# Patient Record
Sex: Female | Born: 1960 | Race: White | Hispanic: No | Marital: Single | Smoking: Never smoker
Health system: Southern US, Community
[De-identification: ages and names within clinical notes are randomized; demographics above are authoritative.]

## PROBLEM LIST (undated history)

## (undated) DIAGNOSIS — F32A Depression, unspecified: Secondary | ICD-10-CM

## (undated) DIAGNOSIS — I10 Essential (primary) hypertension: Secondary | ICD-10-CM

## (undated) DIAGNOSIS — F329 Major depressive disorder, single episode, unspecified: Secondary | ICD-10-CM

## (undated) DIAGNOSIS — E119 Type 2 diabetes mellitus without complications: Secondary | ICD-10-CM

## (undated) DIAGNOSIS — E785 Hyperlipidemia, unspecified: Secondary | ICD-10-CM

## (undated) DIAGNOSIS — F419 Anxiety disorder, unspecified: Secondary | ICD-10-CM

## (undated) HISTORY — DX: Anxiety disorder, unspecified: F41.9

## (undated) HISTORY — DX: Essential (primary) hypertension: I10

## (undated) HISTORY — DX: Major depressive disorder, single episode, unspecified: F32.9

## (undated) HISTORY — PX: WISDOM TOOTH EXTRACTION: SHX21

## (undated) HISTORY — DX: Type 2 diabetes mellitus without complications: E11.9

## (undated) HISTORY — PX: OTHER SURGICAL HISTORY: SHX169

## (undated) HISTORY — DX: Hyperlipidemia, unspecified: E78.5

## (undated) HISTORY — DX: Depression, unspecified: F32.A

---

## 2005-03-26 HISTORY — PX: CHOLECYSTECTOMY: SHX55

## 2012-07-07 ENCOUNTER — Ambulatory Visit: Payer: Self-pay | Admitting: Family Medicine

## 2012-07-21 ENCOUNTER — Ambulatory Visit: Payer: Self-pay | Admitting: Family Medicine

## 2012-07-22 ENCOUNTER — Ambulatory Visit: Payer: Self-pay | Admitting: Family Medicine

## 2012-07-29 ENCOUNTER — Ambulatory Visit: Payer: Self-pay | Admitting: Family Medicine

## 2012-09-04 ENCOUNTER — Ambulatory Visit: Payer: Self-pay | Admitting: Obstetrics and Gynecology

## 2012-09-04 LAB — BASIC METABOLIC PANEL
Anion Gap: 7 (ref 7–16)
BUN: 14 mg/dL (ref 7–18)
Calcium, Total: 9.4 mg/dL (ref 8.5–10.1)
Co2: 29 mmol/L (ref 21–32)
Creatinine: 0.71 mg/dL (ref 0.60–1.30)
EGFR (Non-African Amer.): >60
Glucose: 78 mg/dL (ref 65–99)
Potassium: 3.8 mmol/L (ref 3.5–5.1)
Sodium: 137 mmol/L (ref 136–145)

## 2012-09-04 LAB — HEMOGLOBIN: HGB: 11.9 g/dL — ABNORMAL LOW (ref 12.0–16.0)

## 2012-09-12 ENCOUNTER — Ambulatory Visit: Payer: Self-pay | Admitting: Obstetrics and Gynecology

## 2012-09-15 LAB — PATHOLOGY REPORT

## 2013-03-16 ENCOUNTER — Ambulatory Visit: Payer: Self-pay | Admitting: Gastroenterology

## 2013-03-17 LAB — PATHOLOGY REPORT

## 2013-10-23 ENCOUNTER — Ambulatory Visit: Payer: Self-pay | Admitting: Family Medicine

## 2013-11-24 IMAGING — US US EXTREM UP VENOUS*R*
1 series · 14 of 24 positions shown · non-contrast
Comparison: none

REASON FOR EXAM: CALL REPORT Sarah Cell [phone] Righ forearm pain w
superficial bruising E...
COMMENTS:

PROCEDURE:     US  - US DOPPLER UP EXTR RIGHT  - July 21, 2012  [DATE]
RESULT:     Comparison: None
TECHNIQUE: Multiple gray-scale, color-flow Doppler, and spectral waveform
tracings of the right internal jugular vein, brachiocephalic vein, and
proximal deep veins of the right upper extremity from the antecubital fossa
to the brachiocephalic vein are presented for review.

[Series 1: us extrem up venous*right* · 0.08mm/px · 14 of 43 slices shown]
[im 1/43]
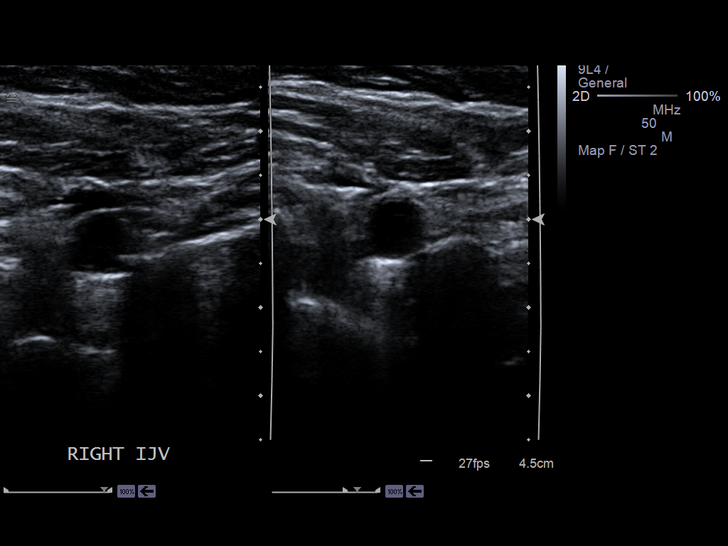
[im 4/43]
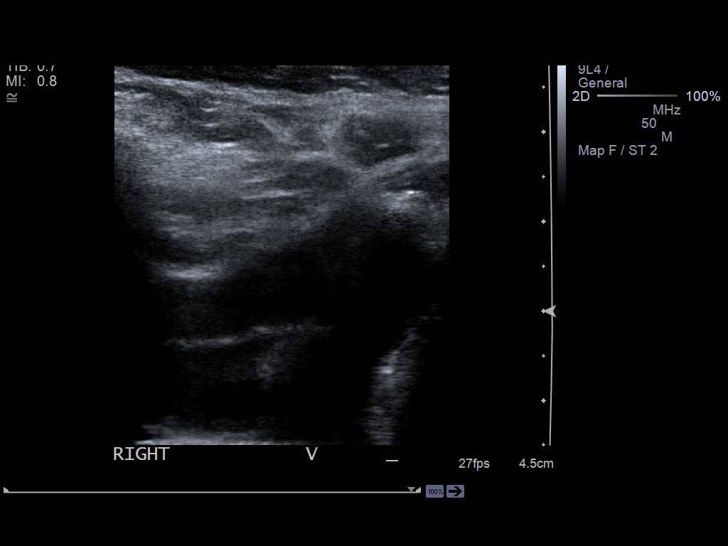
[im 8/43]
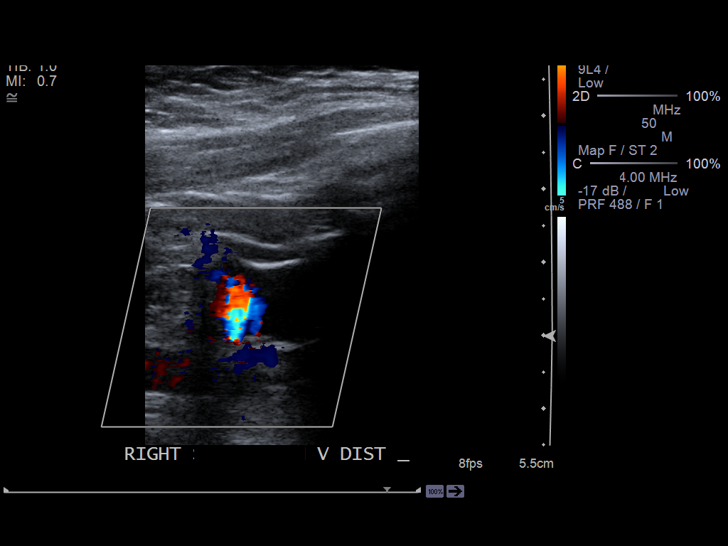
[im 11/43]
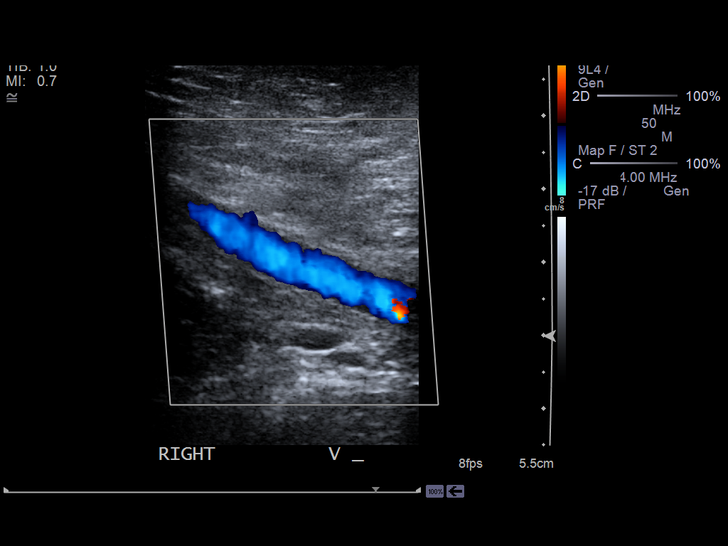
[im 13/43]
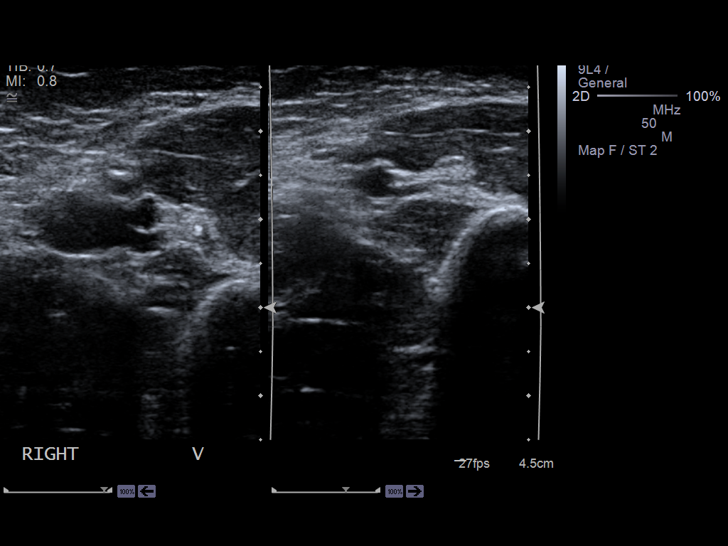
[im 17/43]
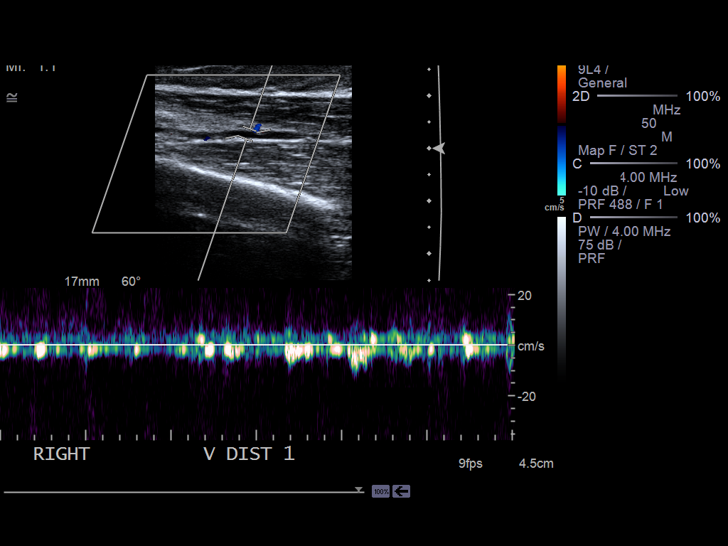
[im 21/43]
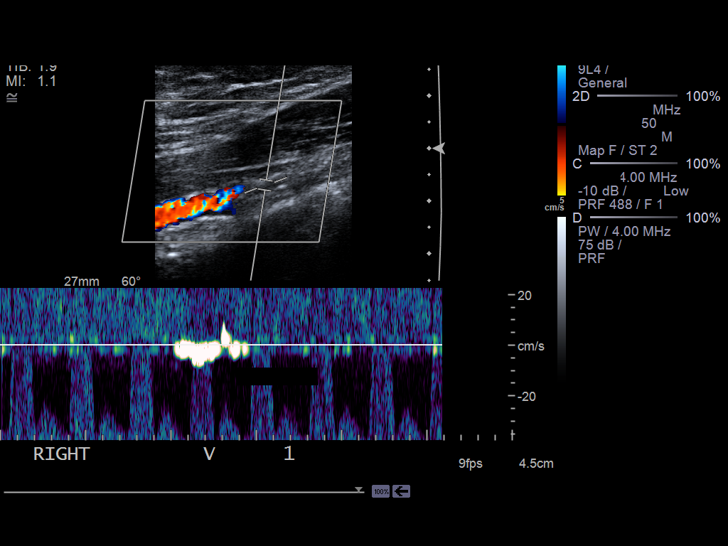
[im 22/43]
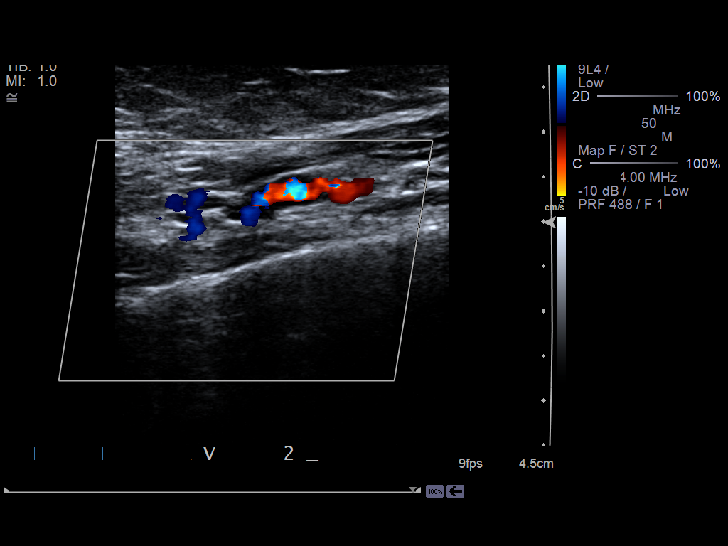
[im 26/43]
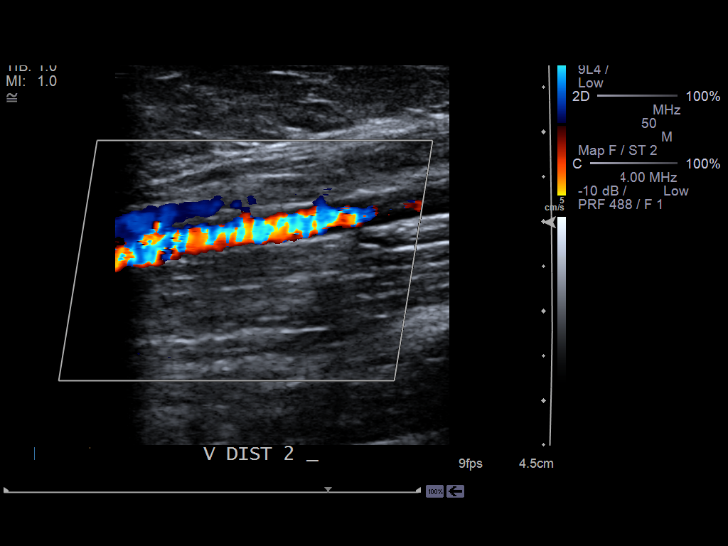
[im 30/43]
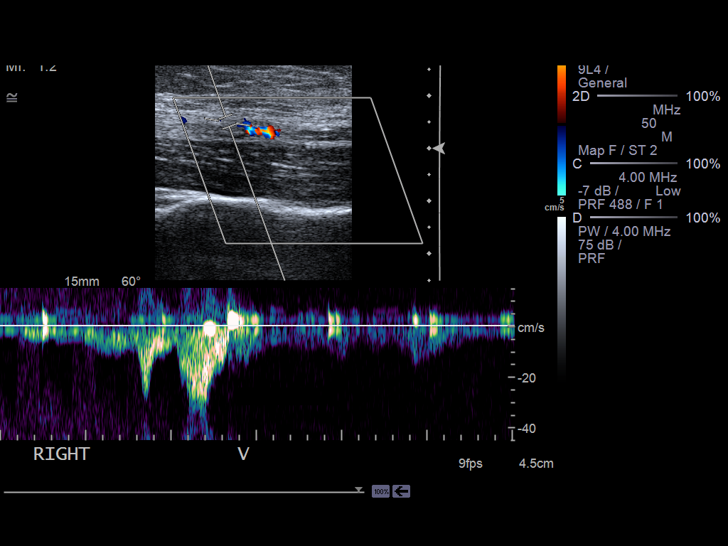
[im 33/43]
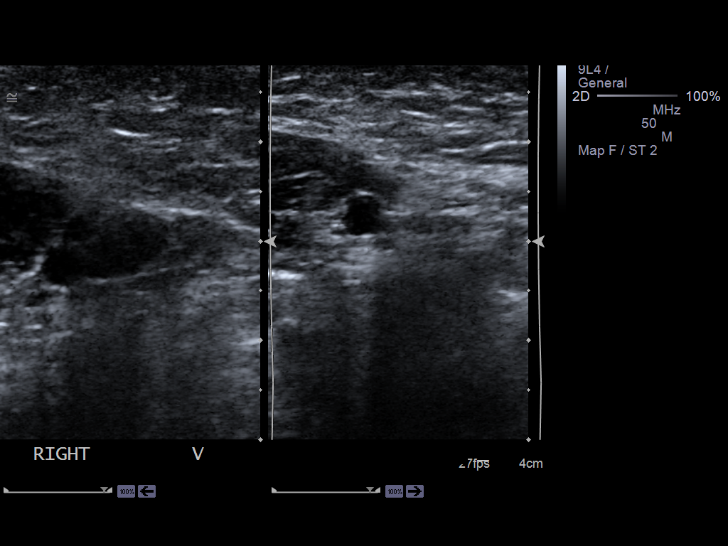
[im 35/43]
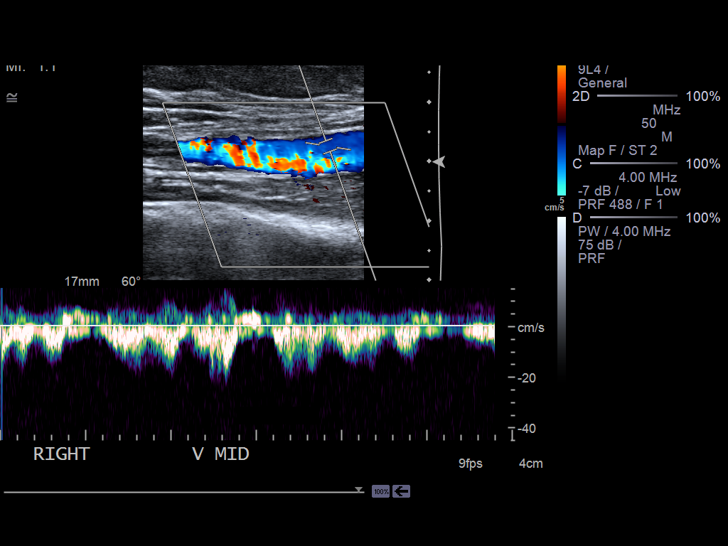
[im 39/43]
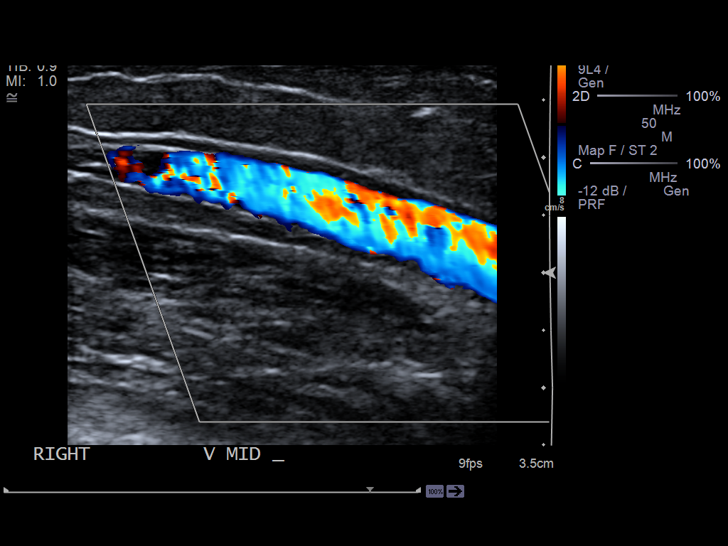
[im 43/43]
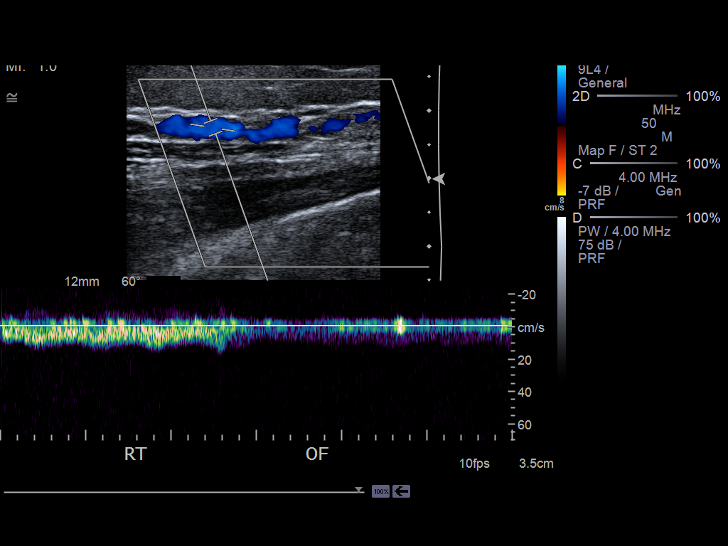

[14 of 24 positions shown; findings below may reference images not displayed]

FINDINGS: There is normal blood flow within the right internal jugular vein,
visualized portions of the brachiocephalic vein, and right upper extremity
deep venous system from the antecubital fossa to the brachiocephalic vein.
IMPRESSION: No sonographic evidence of deep venous thrombosis in the right upper
extremity.

[REDACTED]

## 2013-11-25 IMAGING — US US PELV - US TRANSVAGINAL
1 series · 14 of 25 positions shown · non-contrast
Comparison: none

REASON FOR EXAM: Abn Vaginal Bleeding
COMMENTS:

[Series 1: us pelv - us transvaginal · 0.26mm/px · 14 of 56 slices shown]
[im 1/56]
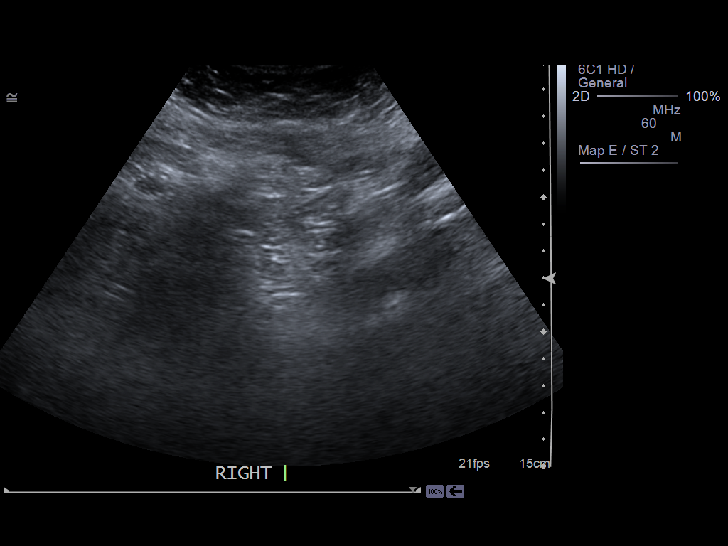
[im 5/56]
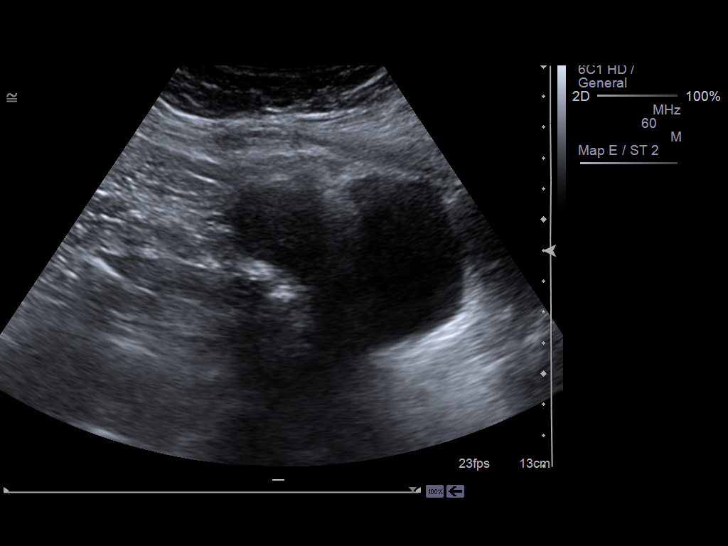
[im 10/56]
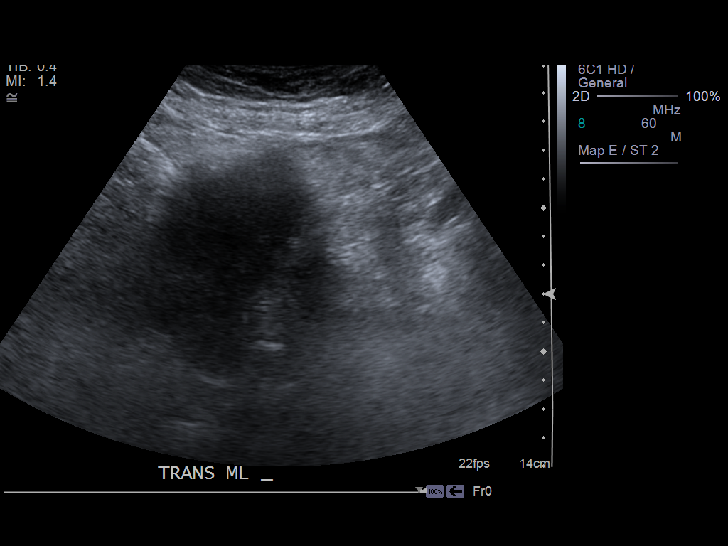
[im 14/56]
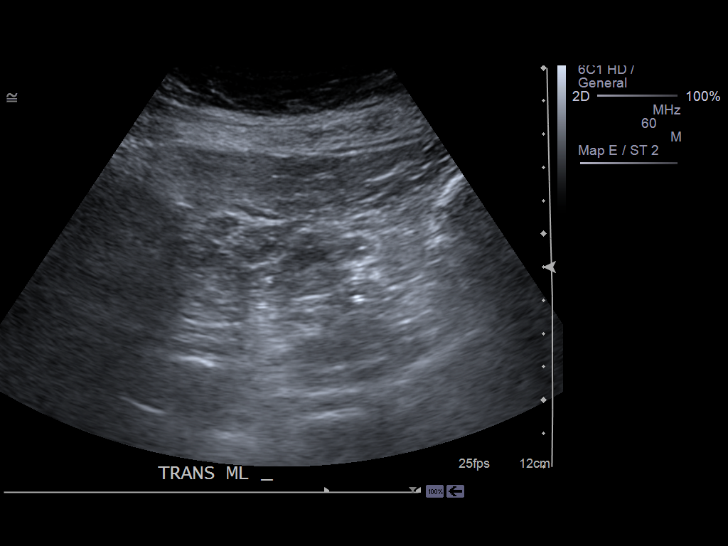
[im 19/56]
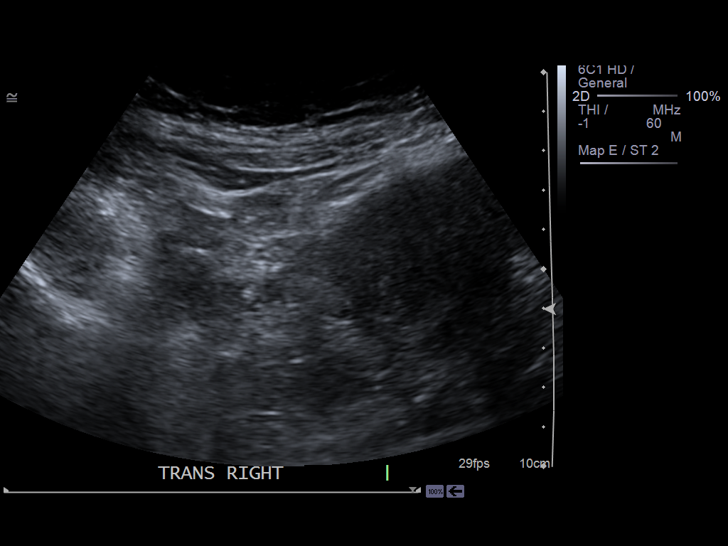
[im 21/56]
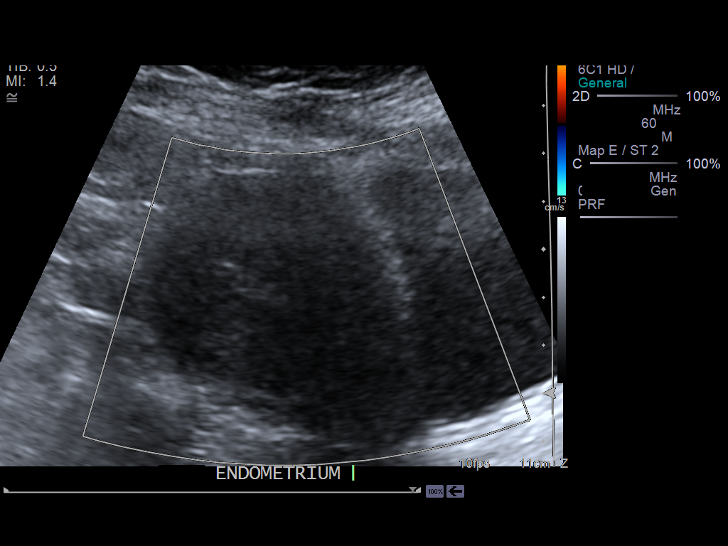
[im 26/56]
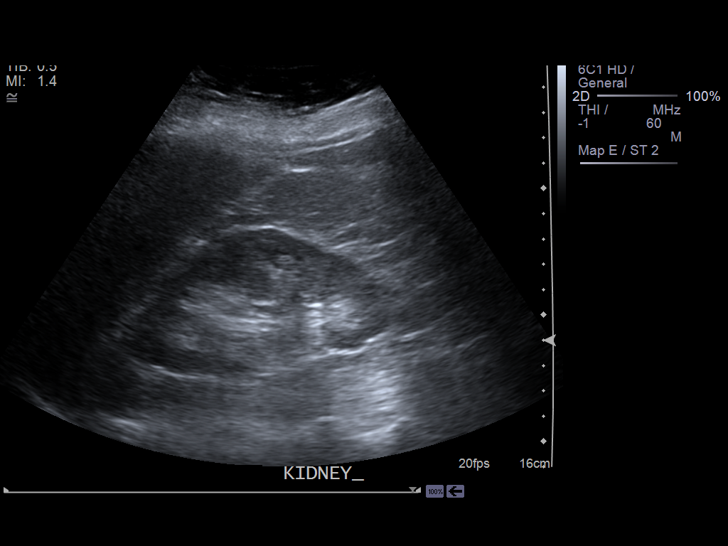
[im 30/56]
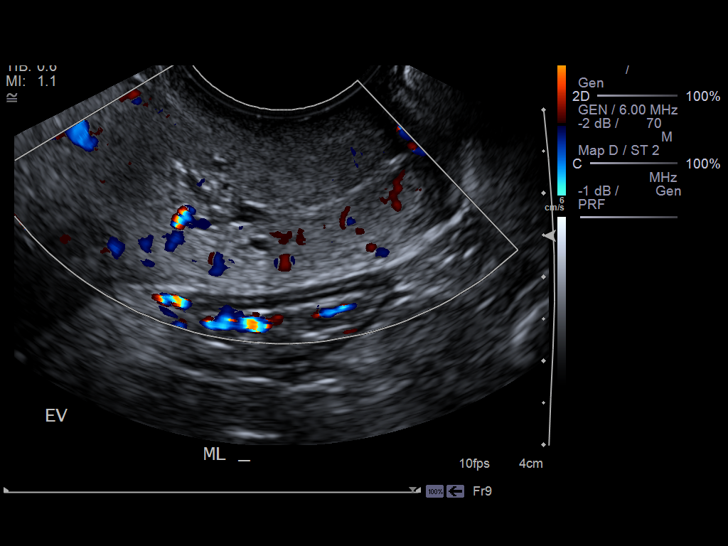
[im 35/56]
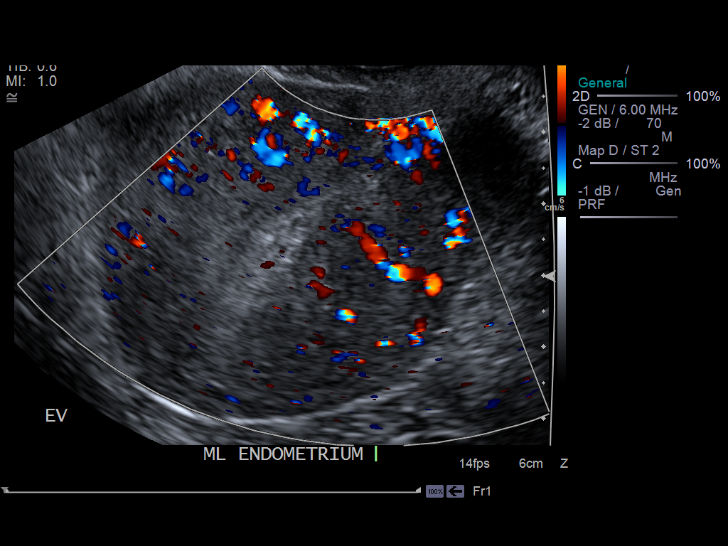
[im 37/56]
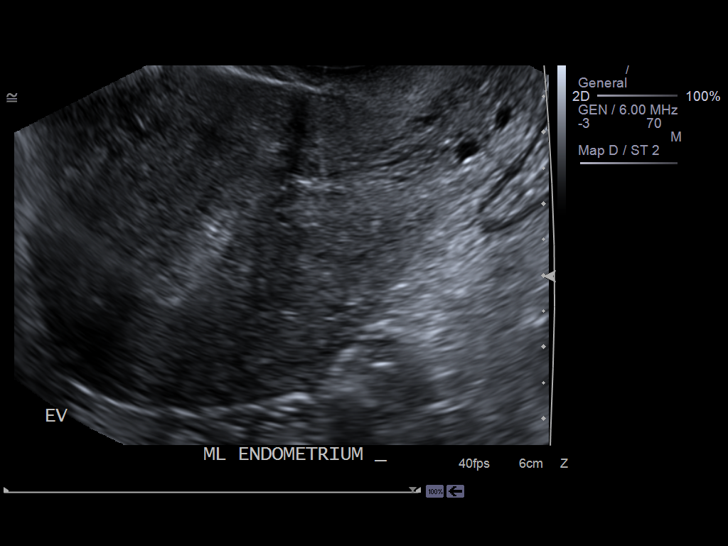
[im 42/56]
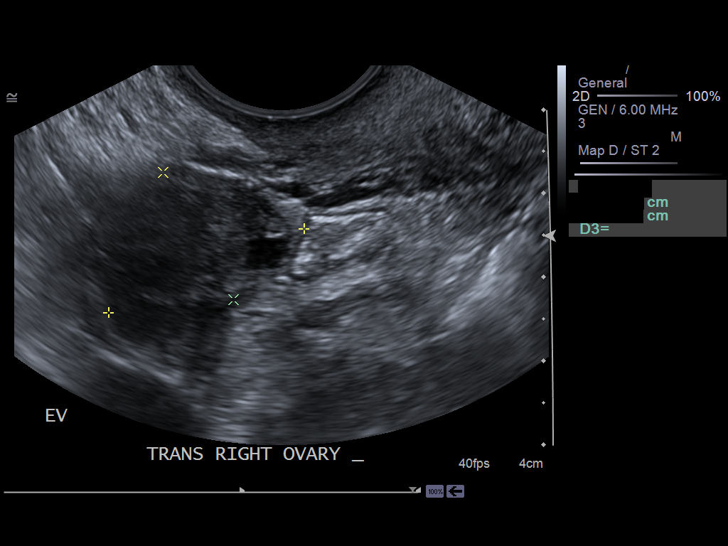
[im 46/56]
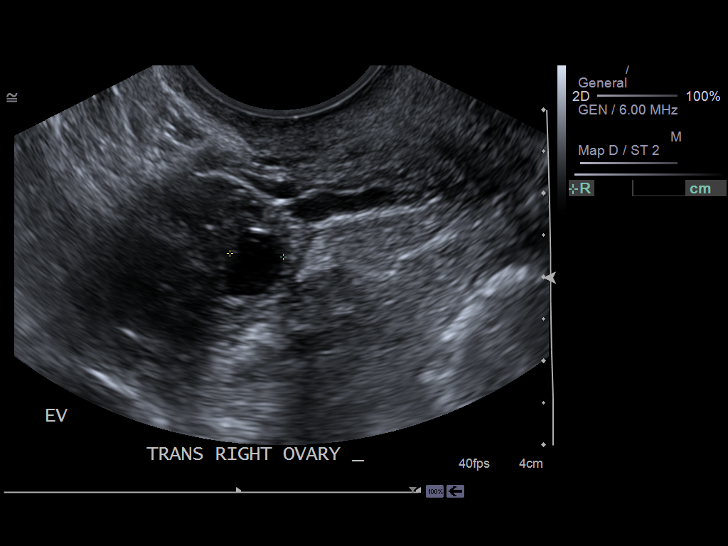
[im 51/56]
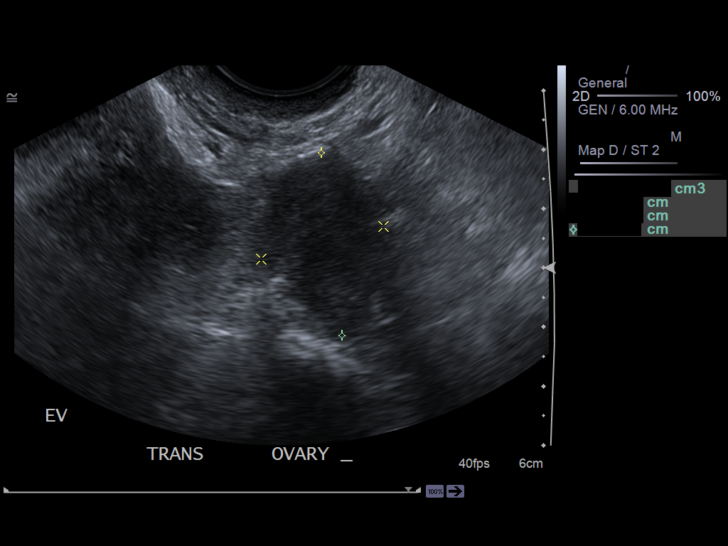
[im 56/56]
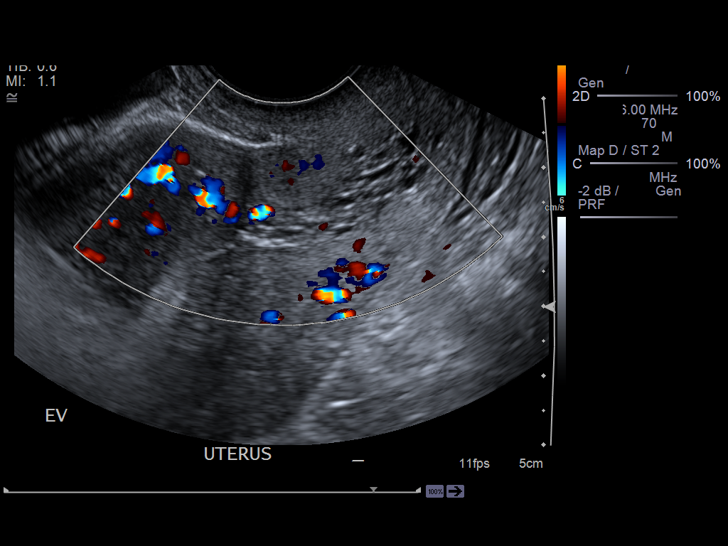

[14 of 25 positions shown; findings below may reference images not displayed]

PROCEDURE:     US  - US PELVIS EXAM W/TRANSVAGINAL  - July 22, 2012  [DATE]

RESULT:     Transabdominal and transvaginal imaging was performed through
the pelvis.

The uterus is normal in echotexture and contour. It measures 8.1 x 6.1 x
cm. The endometrial stripe is thickened at just under 10 cm. There is no
free fluid in the cul-de-sac.

The right ovary measures 2.5 x 1.7 x 2.3 cm and contains a cyst measuring
1.2 x 0.8 x 0.6 cm. The left ovary is normal in echotexture and measures
x 2.1 x 3.1 cm. Vascularity of the ovaries is normal.
IMPRESSION: 1. There is abnormal thickening of the endometrium to just under 10 cm in
thickness. It is somewhat irregular in echotexture and demonstrates some
hypervascularity.
2. There is a simple appearing cyst associated with the normal-appearing
right ovary. The left ovary is normal in appearance.

Gynecological evaluation is recommended.

[REDACTED]

## 2013-12-02 IMAGING — MG MAM DGTL SCRN MAM NO ORDER W/CAD
1 series · 4 of 4 positions shown · non-contrast
Comparison: none

REASON FOR EXAM: SCR MAMMO NO ORDER
COMMENTS:

PROCEDURE:     MAM - MAM DGTL SCRN MAM NO ORDER W/CAD  - July 29, 2012  [DATE]
RESULT:     Nodular parenchymal pattern. No mass. No pathologic clustered
calcification. CAD evaluation is nonfocal.

[R CC · right · 4 of 4 slices shown]
[im 1/4]
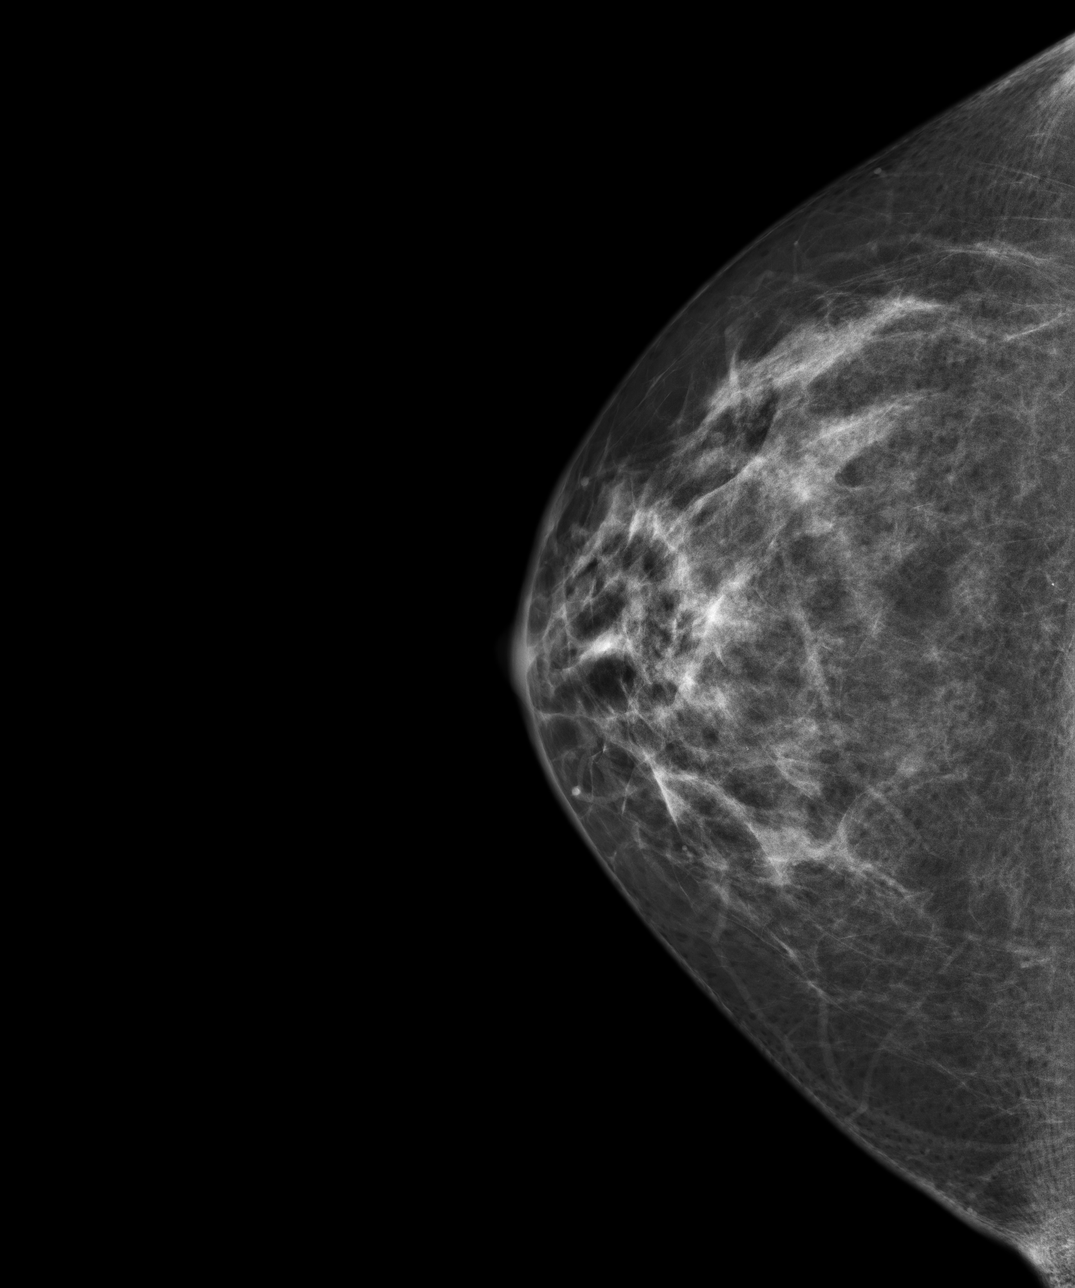
[im 2/4]
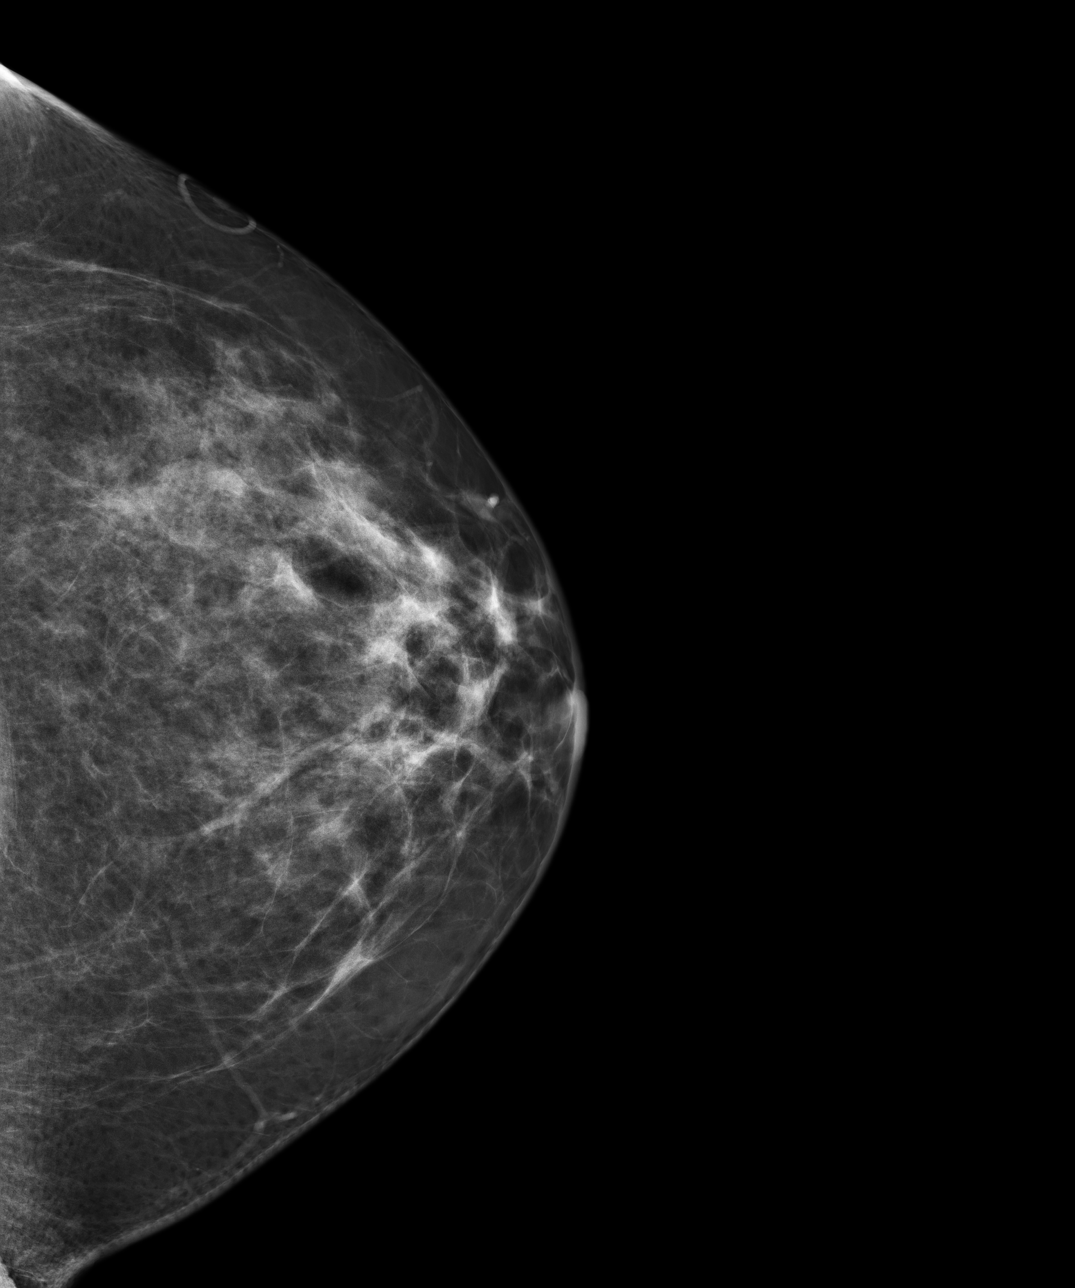
[im 3/4]
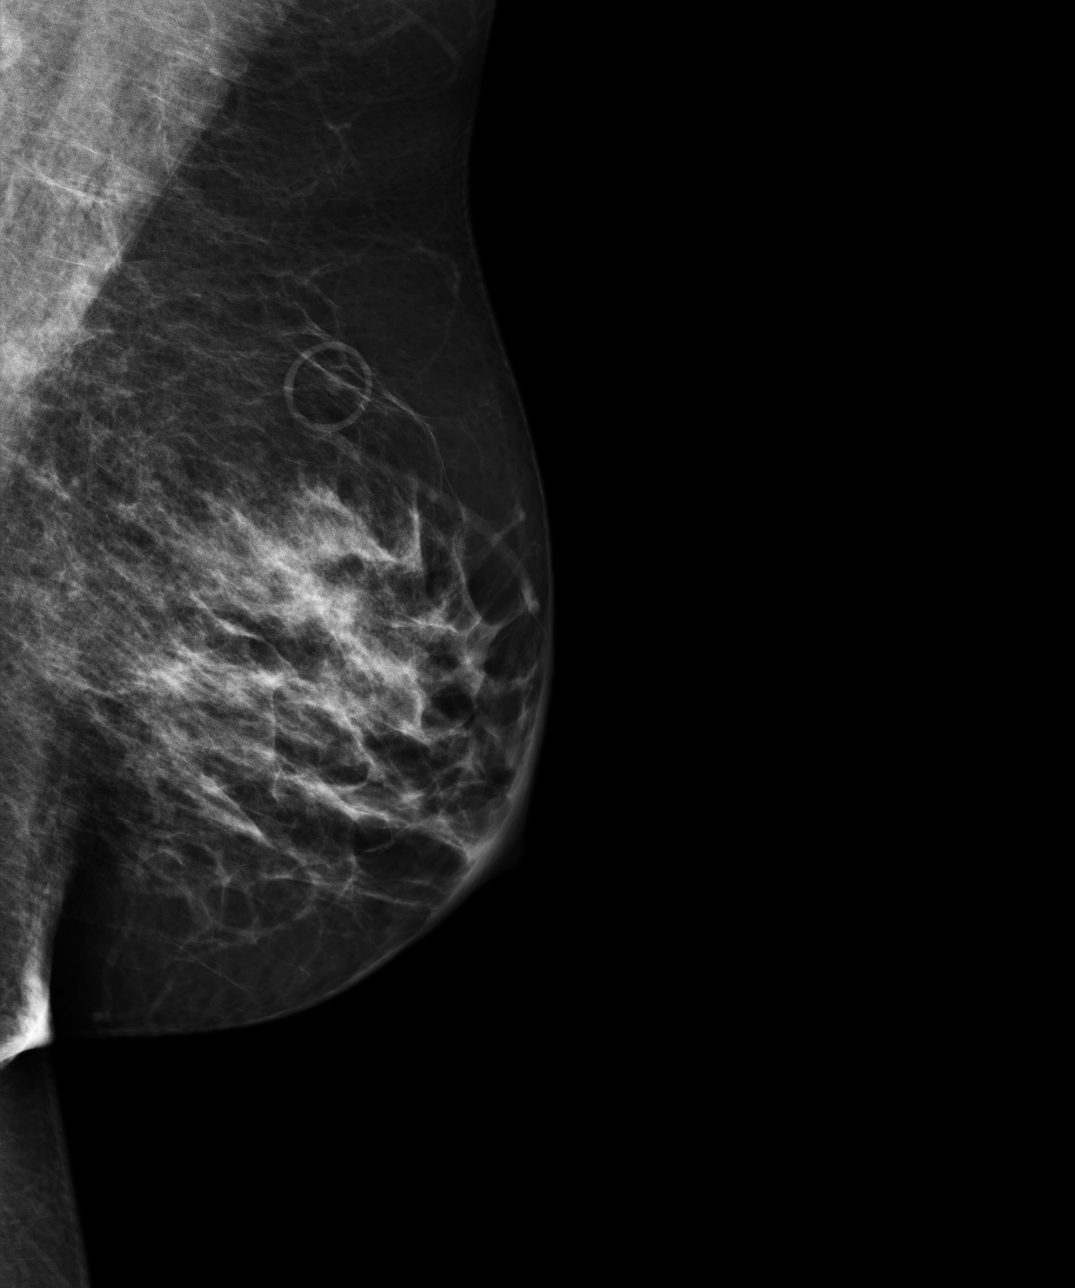
[im 4/4]
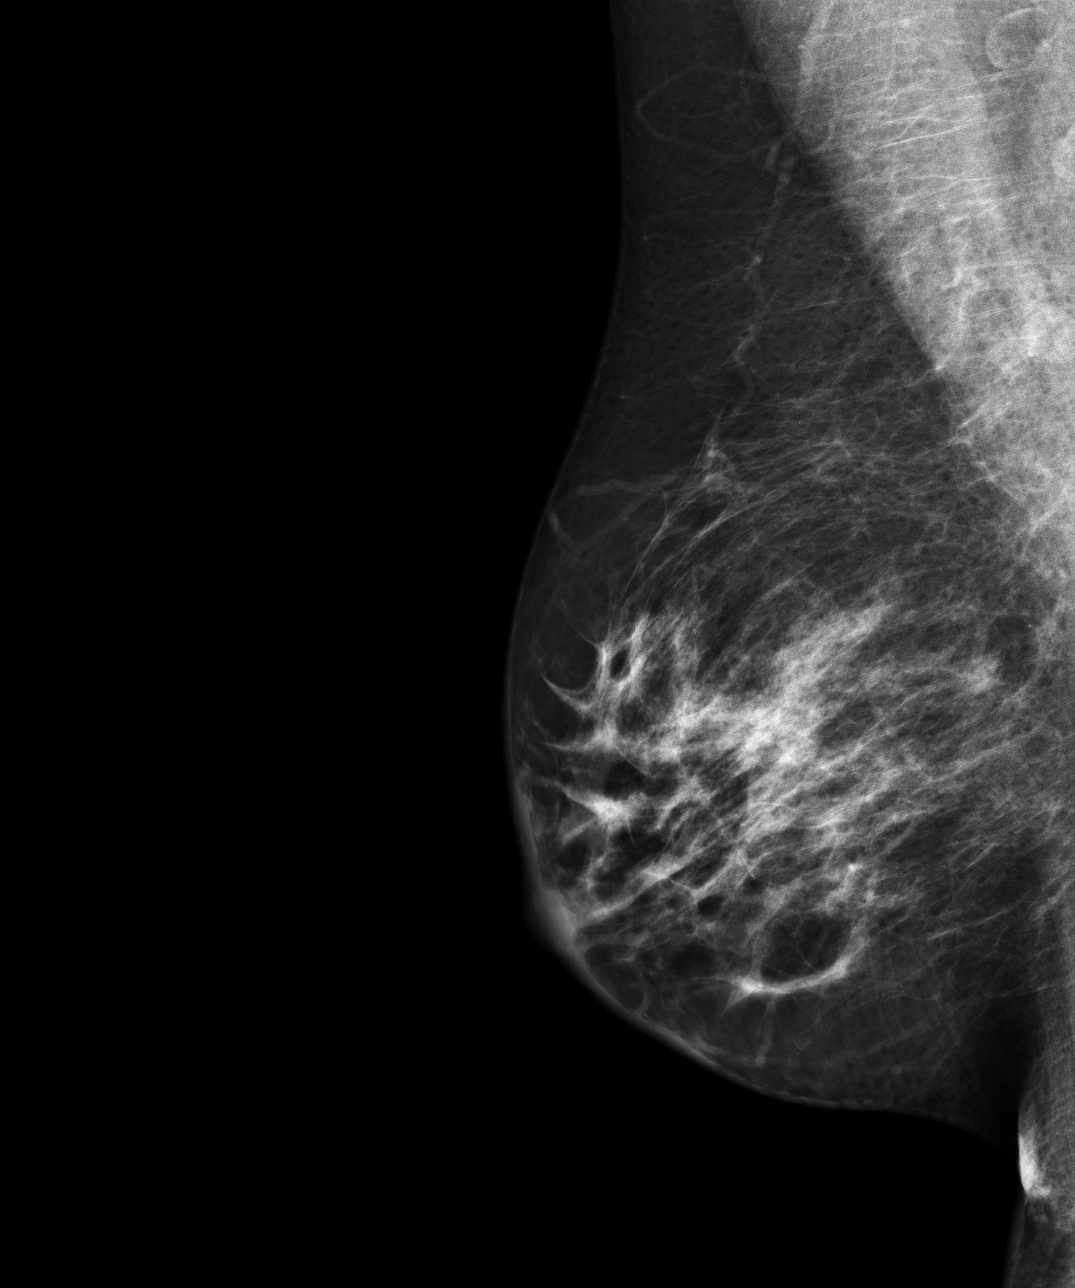

[4 of 4 positions shown; findings below may reference images not displayed]

IMPRESSION: Benign exam.

BI-RADS: Category 2- Benign Finding

A NEGATIVE MAMMOGRAM REPORT DOES NOT PRECLUDE BIOPSY OR OTHER EVALUATION OF
A CLINICALLY PALPABLE OR OTHERWISE SUSPICIOUS MASS OR LESION. BREAST CANCER
MAY NOT BE DETECTED IN UP TO 10% OF CASES.

Thank you for the oppurtunity to contribute to the care of your patient. .
BREAST COMPOSITION: The breast composition is HETEROGENEOUSLY DENSE
(glandular tissue is 51-75%) This may decrease the sensitivity of
mammography.

## 2014-04-07 ENCOUNTER — Ambulatory Visit: Payer: Self-pay | Admitting: Family Medicine

## 2014-06-15 ENCOUNTER — Ambulatory Visit: Payer: Self-pay | Admitting: Family Medicine

## 2014-07-16 NOTE — Op Note (Signed)
PATIENT NAME:  Madison Price, Madison Price MR#:  [id number] DATE OF BIRTH:  04/07/1960  DATE OF PROCEDURE:  09/12/2012  PREOPERATIVE DIAGNOSIS:  1.  Postmenopausal bleeding.  2.  Endometrial polyp.   POSTOPERATIVE DIAGNOSES:  1.  Postmenopausal bleeding.  2.  Endometrial polyp.   PROCEDURES:  1.  Fractional dilation and curettage. 2.  Hysteroscopic removal of endometrial polyp.   SURGEON: Laverta Baltimore, M.D.   ANESTHESIA: General endotracheal anesthesia.   INDICATIONS: A 54 year old gravida 2, para 2 patient with postmenopausal bleeding and a thickened endometrial stripe. Saline infusion sonohysterography revealed a 6 x 8 mm endometrial polyp.   DESCRIPTION OF PROCEDURE: After adequate general endotracheal anesthesia, the patient was placed in the dorsal supine position with legs placed in the [place] stirrups. A perineal and vaginal prep was performed with Betadine. The patient previously received 2 grams of IV cefoxitin prior to commencement of the case. A weighted speculum was placed in the posterior vaginal vault and the anterior cervix was grasped with a single-tooth tenaculum. Endocervical curettage was performed. The uterus was sounded to 9 cm and cervix was dilated to #17 Hanks dilator. Hysteroscope was advanced into the endometrial cavity with 1.5% Glycine used as the distending medium. The polyp was identified at the fundal region approximately 6 x 8 mm. The hysteroscope was removed and the resectoscope was then assembled. The cervix was dilated to #20 Hanks dilator and the hysteroscope was advanced without difficulty. Resectoscope was used to dissect the polyp. The hysteroscope was removed and fragments of a polyp removed as well as endometrial curettings after endometrial curettage was performed. Good hemostasis was noted. The patient tolerated the procedure well.  ESTIMATED BLOOD LOSS: 20 mL.  INTRAOPERATIVE FLUIDS: 900 mL.  1360 mL Glycine used with a net deficit of -20 mL.   The  patient was taken to the recovery room in good condition.    ____________________________ Boykin Nearing, MD tjs:aw D: 09/12/2012 08:33:52 ET T: 09/12/2012 08:40:52 ET JOB#: [id number]  cc: Boykin Nearing, MD, <Dictator> Boykin Nearing MD ELECTRONICALLY SIGNED 09/15/2012 10:00

## 2014-07-27 DIAGNOSIS — M5412 Radiculopathy, cervical region: Secondary | ICD-10-CM | POA: Insufficient documentation

## 2014-07-27 HISTORY — DX: Radiculopathy, cervical region: M54.12

## 2014-10-25 ENCOUNTER — Telehealth: Payer: Self-pay | Admitting: Family Medicine

## 2014-10-25 MED ORDER — VALSARTAN-HYDROCHLOROTHIAZIDE 80-12.5 MG PO TABS: 1.0000 | ORAL_TABLET | Freq: Every day | ORAL | Status: DC

## 2014-10-25 NOTE — Telephone Encounter (Signed)
Medication has been refilled and sent to [facility], transferred patient to front desk to schedule follow up appointment

## 2014-10-25 NOTE — Telephone Encounter (Signed)
Tried contacting the patient. She did not answer and her mailbox is full.

## 2014-11-17 ENCOUNTER — Ambulatory Visit (INDEPENDENT_AMBULATORY_CARE_PROVIDER_SITE_OTHER): Payer: BC Managed Care – PPO | Admitting: Family Medicine

## 2014-11-17 ENCOUNTER — Encounter: Payer: Self-pay | Admitting: Family Medicine

## 2014-11-17 VITALS — BP 122/94 | HR 94 | Temp 98.2°F | Ht 64.0 in | Wt 201.1 lb

## 2014-11-17 DIAGNOSIS — I10 Essential (primary) hypertension: Secondary | ICD-10-CM | POA: Diagnosis not present

## 2014-11-17 DIAGNOSIS — E119 Type 2 diabetes mellitus without complications: Secondary | ICD-10-CM | POA: Diagnosis not present

## 2014-11-17 DIAGNOSIS — F329 Major depressive disorder, single episode, unspecified: Secondary | ICD-10-CM | POA: Diagnosis not present

## 2014-11-17 DIAGNOSIS — E785 Hyperlipidemia, unspecified: Secondary | ICD-10-CM

## 2014-11-17 DIAGNOSIS — F32A Depression, unspecified: Secondary | ICD-10-CM

## 2014-11-18 ENCOUNTER — Encounter: Payer: Self-pay | Admitting: Family Medicine

## 2014-11-18 DIAGNOSIS — F32A Depression, unspecified: Secondary | ICD-10-CM | POA: Insufficient documentation

## 2014-11-18 DIAGNOSIS — E119 Type 2 diabetes mellitus without complications: Secondary | ICD-10-CM | POA: Insufficient documentation

## 2014-11-18 DIAGNOSIS — E785 Hyperlipidemia, unspecified: Secondary | ICD-10-CM | POA: Insufficient documentation

## 2014-11-18 DIAGNOSIS — F329 Major depressive disorder, single episode, unspecified: Secondary | ICD-10-CM | POA: Insufficient documentation

## 2014-11-18 DIAGNOSIS — I10 Essential (primary) hypertension: Secondary | ICD-10-CM | POA: Insufficient documentation

## 2014-11-18 DIAGNOSIS — F419 Anxiety disorder, unspecified: Secondary | ICD-10-CM | POA: Insufficient documentation

## 2014-11-18 DIAGNOSIS — E1165 Type 2 diabetes mellitus with hyperglycemia: Secondary | ICD-10-CM | POA: Insufficient documentation

## 2014-11-18 NOTE — Progress Notes (Signed)
Name: Madison Price   MRN: [id number]    DOB: 11-May-1960   Date:11/18/2014       Progress Note  Subjective  Chief Complaint  Chief Complaint  Patient presents with  . Medication Refill  . Diabetes  . Hyperlipidemia  . Depression  . Hypertension    Diabetes She presents for her follow-up diabetic visit. She has type 2 diabetes mellitus. Hypoglycemia symptoms include nervousness/anxiousness. Pertinent negatives for hypoglycemia include no headaches. Associated symptoms include fatigue. Pertinent negatives for diabetes include no chest pain and no weight loss. Symptoms are stable. Pertinent negatives for diabetic complications include no CVA. Current diabetic treatment includes oral agent (monotherapy) (Stopped metformin because of persistently dropping blood sugars.). She is following a generally healthy diet. Her breakfast blood glucose range is generally <70 mg/dl. An ACE inhibitor/angiotensin II receptor blocker is being taken.  Hyperlipidemia This is a chronic problem. The problem is controlled. Exacerbating diseases include diabetes. Pertinent negatives include no chest pain or shortness of breath. She is currently on no antihyperlipidemic treatment. Compliance problems include medication cost (Stopped Crestor because it was too expensive.).   Depression      The patient presents with depression.  This is a chronic problem.  The problem has been gradually worsening (Ascending depression and anxiety since her daughter moved in with her) since onset.  Associated symptoms include fatigue and helplessness.  Associated symptoms include no headaches.     The symptoms are aggravated by family issues.  Past treatments include SNRIs - Serotonin and norepinephrine reuptake inhibitors.  Compliance with treatment is good.  Past medical history includes depression.   Hypertension This is a chronic problem. The problem is controlled. Pertinent negatives include no chest pain, headaches,  palpitations (Pt. has noted her heart occasionally skips a beat.) or shortness of breath. Past treatments include angiotensin blockers, diuretics and lifestyle changes. There is no history of kidney disease, CAD/MI or CVA.    Past Medical History  Diagnosis Date  . Anxiety   . Diabetes mellitus without complication   . Hypertension   . Hyperlipidemia   . Depression     Past Surgical History  Procedure Laterality Date  . Cholecystectomy  2007  . Cesarean section      Family History  Problem Relation Age of Onset  . Schizophrenia Mother     paranoid  . Heart disease Father   . Cancer Sister     breast  . Cancer Maternal Aunt     colon    Social History   Social History  . Marital Status: Single    Spouse Name: N/A  . Number of Children: N/A  . Years of Education: N/A   Occupational History  . Not on file.   Social History Main Topics  . Smoking status: Never Smoker   . Smokeless tobacco: Never Used  . Alcohol Use: No  . Drug Use: No  . Sexual Activity: Not on file   Other Topics Concern  . Not on file   Social History Narrative     Current outpatient prescriptions:  .  meloxicam (MOBIC) 7.5 MG tablet, , Disp: , Rfl:  .  valsartan-hydrochlorothiazide (DIOVAN-HCT) 80-12.5 MG per tablet, Take 1 tablet by mouth daily., Disp: 30 tablet, Rfl: 0 .  Venlafaxine HCl 75 MG TB24, , Disp: , Rfl:   No Known Allergies   Review of Systems  Constitutional: Positive for fatigue. Negative for weight loss.  Respiratory: Negative for shortness of breath.   Cardiovascular: Negative  for chest pain and palpitations (Pt. has noted her heart occasionally skips a beat.).  Neurological: Negative for headaches.  Psychiatric/Behavioral: Positive for depression. The patient is nervous/anxious.     Objective  Filed Vitals:   11/18/14 0903  BP: 122/94  Pulse: 94  Temp: 98.2 F (36.8 C)  TempSrc: Oral  Height: 5\' 4"  (1.626 m)  Weight: 201 lb 1.6 oz (91.218 kg)  SpO2: 97%     Physical Exam  Constitutional: She is oriented to person, place, and time and well-developed, well-nourished, and in no distress.  HENT:  Head: Normocephalic and atraumatic.  Cardiovascular: Normal rate and regular rhythm.   Pulmonary/Chest: Effort normal and breath sounds normal.  Abdominal: Soft. Bowel sounds are normal.  Neurological: She is alert and oriented to person, place, and time.  Skin: Skin is warm and dry.  Psychiatric: Memory, affect and judgment normal.  Nursing note and vitals reviewed.   Assessment & Plan  1. Essential hypertension DBP is elevated, most likely from the stress and anxiety that she is currently experiencing. Continue present therapy and recheck in 2 weeks.  2. Diabetes mellitus without complication Recheck U1L and adjust medication accordingly. - HgB A1c  3. Depression Patient is experiencing increased stress and anxiety and believes that the Effexor is not working. She will taper off Effexor and return in 2 weeks to be started on an alternative SSRI plus an anxiolytic.  4. Hyperlipidemia Patient has stopped Crestor. Recheck FLP and liver enzymes and consider starting on a less expensive statin such as Lipitor.  - Lipid Profile - Comprehensive Metabolic Panel (CMET)   Jidenna Figgs [facility] 11/18/2014 6:10 PM

## 2014-11-19 ENCOUNTER — Other Ambulatory Visit: Payer: Self-pay | Admitting: Family Medicine

## 2014-11-19 MED ORDER — VENLAFAXINE HCL ER 75 MG PO TB24: 75.0000 mg | ORAL_TABLET | Freq: Every day | ORAL | Status: DC

## 2014-11-19 NOTE — Telephone Encounter (Signed)
Venlafaxine HCI 75 mg has been refilled and sent to [facility]

## 2014-11-24 ENCOUNTER — Telehealth: Payer: Self-pay | Admitting: Family Medicine

## 2014-11-24 NOTE — Telephone Encounter (Signed)
Pt would like a call back. She states there is a issue with her meds.

## 2014-11-25 NOTE — Telephone Encounter (Signed)
Returned patient's call concerning medication problem but was unable to reach her voicemail was full, will try call again later

## 2014-11-25 NOTE — Telephone Encounter (Signed)
Spoke with pharmacy and all medication issues has been resolved, patient has been informed

## 2014-11-26 LAB — COMPREHENSIVE METABOLIC PANEL
ALBUMIN: 4.5 g/dL (ref 3.5–5.5)
ALT: 19 IU/L (ref 0–32)
AST: 17 IU/L (ref 0–40)
Albumin/Globulin Ratio: 1.7 (ref 1.1–2.5)
Alkaline Phosphatase: 80 IU/L (ref 39–117)
BUN/Creatinine Ratio: 21 (ref 9–23)
BUN: 15 mg/dL (ref 6–24)
Bilirubin Total: 0.4 mg/dL (ref 0.0–1.2)
CALCIUM: 9.5 mg/dL (ref 8.7–10.2)
CO2: 24 mmol/L (ref 18–29)
CREATININE: 0.72 mg/dL (ref 0.57–1.00)
Chloride: 99 mmol/L (ref 97–108)
GFR calc Af Amer: 111 mL/min/{1.73_m2} (ref >59)
GFR, EST NON AFRICAN AMERICAN: 96 mL/min/{1.73_m2} (ref >59)
GLOBULIN, TOTAL: 2.6 g/dL (ref 1.5–4.5)
GLUCOSE: 100 mg/dL — AB (ref 65–99)
Potassium: 4.8 mmol/L (ref 3.5–5.2)
SODIUM: 139 mmol/L (ref 134–144)
Total Protein: 7.1 g/dL (ref 6.0–8.5)

## 2014-11-26 LAB — LIPID PANEL
CHOLESTEROL TOTAL: 190 mg/dL (ref 100–199)
Chol/HDL Ratio: 5.3 ratio units — ABNORMAL HIGH (ref 0.0–4.4)
HDL: 36 mg/dL — ABNORMAL LOW (ref >39)
LDL CALC: 132 mg/dL — AB (ref 0–99)
Triglycerides: 108 mg/dL (ref 0–149)
VLDL CHOLESTEROL CAL: 22 mg/dL (ref 5–40)

## 2014-11-26 LAB — HEMOGLOBIN A1C: Hgb A1c MFr Bld: 6.4 % — ABNORMAL HIGH (ref 4.8–5.6)

## 2014-12-01 ENCOUNTER — Encounter (INDEPENDENT_AMBULATORY_CARE_PROVIDER_SITE_OTHER): Payer: Self-pay

## 2014-12-01 ENCOUNTER — Ambulatory Visit (INDEPENDENT_AMBULATORY_CARE_PROVIDER_SITE_OTHER): Payer: BC Managed Care – PPO | Admitting: Family Medicine

## 2014-12-01 ENCOUNTER — Encounter: Payer: Self-pay | Admitting: Family Medicine

## 2014-12-01 VITALS — BP 128/80 | HR 116 | Temp 97.8°F | Resp 19 | Ht 64.0 in | Wt 197.5 lb

## 2014-12-01 DIAGNOSIS — F419 Anxiety disorder, unspecified: Secondary | ICD-10-CM

## 2014-12-01 DIAGNOSIS — F329 Major depressive disorder, single episode, unspecified: Secondary | ICD-10-CM | POA: Diagnosis not present

## 2014-12-01 DIAGNOSIS — E785 Hyperlipidemia, unspecified: Secondary | ICD-10-CM

## 2014-12-01 DIAGNOSIS — F32A Depression, unspecified: Secondary | ICD-10-CM

## 2014-12-01 MED ORDER — ESCITALOPRAM OXALATE 10 MG PO TABS: 10.0000 mg | ORAL_TABLET | Freq: Every day | ORAL | Status: DC

## 2014-12-01 MED ORDER — ATORVASTATIN CALCIUM 20 MG PO TABS: 20.0000 mg | ORAL_TABLET | Freq: Every day | ORAL | Status: DC

## 2014-12-01 MED ORDER — ALPRAZOLAM 0.25 MG PO TABS: 0.2500 mg | ORAL_TABLET | Freq: Two times a day (BID) | ORAL | Status: DC | PRN

## 2014-12-01 NOTE — Progress Notes (Signed)
Name: Madison Price   MRN: [id number]    DOB: 1961/01/22   Date:12/01/2014       Progress Note  Subjective  Chief Complaint  Chief Complaint  Patient presents with  . Follow-up    2 wk  . Diabetes  . Hyperlipidemia  . Hypertension  . Depression  . Anxiety    Hyperlipidemia This is a chronic problem. The problem is uncontrolled. Recent lipid tests were reviewed and are high. Pertinent negatives include no leg pain, myalgias or shortness of breath. She is currently on no antihyperlipidemic treatment. Compliance problems include medication cost (Stopped Crestor due to cost.).  Risk factors for coronary artery disease include dyslipidemia.  Depression      The patient presents with depression.  This is a chronic problem.  Associated symptoms include helplessness and decreased interest.  Associated symptoms include no decreased concentration, does not have insomnia, no appetite change and no myalgias.     The symptoms are aggravated by family issues (worried about her daughter who is causing her a lot of anxiety.).  Past treatments include SNRIs - Serotonin and norepinephrine reuptake inhibitors.  Past medical history includes anxiety and depression.   Anxiety Presents for follow-up visit. Symptoms include depressed mood, excessive worry, irritability, muscle tension and nervous/anxious behavior. Patient reports no decreased concentration, insomnia or shortness of breath.   Her past medical history is significant for depression. Past treatments include SSRIs.   Past Medical History  Diagnosis Date  . Anxiety   . Diabetes mellitus without complication   . Hypertension   . Hyperlipidemia   . Depression     Past Surgical History  Procedure Laterality Date  . Cholecystectomy  2007  . Cesarean section      Family History  Problem Relation Age of Onset  . Schizophrenia Mother     paranoid  . Heart disease Father   . Cancer Sister     breast  . Cancer Maternal Aunt    colon    Social History   Social History  . Marital Status: Single    Spouse Name: N/A  . Number of Children: N/A  . Years of Education: N/A   Occupational History  . Not on file.   Social History Main Topics  . Smoking status: Never Smoker   . Smokeless tobacco: Never Used  . Alcohol Use: No  . Drug Use: No  . Sexual Activity: Not on file   Other Topics Concern  . Not on file   Social History Narrative     Current outpatient prescriptions:  .  valsartan-hydrochlorothiazide (DIOVAN-HCT) 80-12.5 MG per tablet, Take 1 tablet by mouth daily., Disp: 30 tablet, Rfl: 0 .  Venlafaxine HCl 75 MG TB24, Take 1 tablet (75 mg total) by mouth daily., Disp: 30 tablet, Rfl: 0 .  meloxicam (MOBIC) 7.5 MG tablet, , Disp: , Rfl:   No Known Allergies   Review of Systems  Constitutional: Positive for irritability. Negative for appetite change.  Respiratory: Negative for shortness of breath.   Musculoskeletal: Negative for myalgias.  Psychiatric/Behavioral: Positive for depression. Negative for decreased concentration. The patient is nervous/anxious. The patient does not have insomnia.    Objective  Filed Vitals:   12/01/14 0835  BP: 128/80  Pulse: 116  Temp: 97.8 F (36.6 C)  TempSrc: Oral  Resp: 19  Height: 5\' 4"  (1.626 m)  Weight: 197 lb 8 oz (89.585 kg)  SpO2: 94%    Physical Exam  Constitutional: She is oriented to  person, place, and time and well-developed, well-nourished, and in no distress.  Cardiovascular: Normal rate and regular rhythm.   Pulmonary/Chest: Effort normal and breath sounds normal.  Neurological: She is alert and oriented to person, place, and time.  Psychiatric: Memory, affect and judgment normal.  Nursing note and vitals reviewed.   Assessment & Plan  1. Depression  DC venlafaxine and started on Lexapro 10 mg daily. Follow-up in one month - escitalopram (LEXAPRO) 10 MG tablet; Take 1 tablet (10 mg total) by mouth daily.  Dispense: 30 tablet;  Refill: 0   2. Hyperlipidemia  DC Crestor and started patient on Lipitor 20 mg at bedtime. Follow-up in 3 months with repeat FLP - atorvastatin (LIPITOR) 20 MG tablet; Take 1 tablet (20 mg total) by mouth daily.  Dispense: 90 tablet; Refill: 3  3. Anxiety  Started on alprazolam when necessary for anxiety.follow-up in one month. - ALPRAZolam (XANAX) 0.25 MG tablet; Take 1 tablet (0.25 mg total) by mouth 2 (two) times daily as needed for anxiety.  Dispense: 60 tablet; Refill: 0   Madison Price [facility] 12/01/2014 8:52 AM

## 2014-12-30 ENCOUNTER — Telehealth: Payer: Self-pay | Admitting: Family Medicine

## 2014-12-30 ENCOUNTER — Other Ambulatory Visit: Payer: Self-pay | Admitting: Family Medicine

## 2014-12-30 DIAGNOSIS — F32A Depression, unspecified: Secondary | ICD-10-CM

## 2014-12-30 DIAGNOSIS — F329 Major depressive disorder, single episode, unspecified: Secondary | ICD-10-CM

## 2014-12-30 MED ORDER — ESCITALOPRAM OXALATE 10 MG PO TABS: 10.0000 mg | ORAL_TABLET | Freq: Every day | ORAL | Status: DC

## 2014-12-30 NOTE — Telephone Encounter (Signed)
Medication has been refilled and called into pharmacy per Dr. Manuella Ghazi

## 2014-12-31 ENCOUNTER — Ambulatory Visit: Payer: BC Managed Care – PPO | Admitting: Family Medicine

## 2015-02-09 ENCOUNTER — Other Ambulatory Visit: Payer: Self-pay

## 2015-02-09 ENCOUNTER — Other Ambulatory Visit: Payer: Self-pay | Admitting: Family Medicine

## 2015-02-09 DIAGNOSIS — F32A Depression, unspecified: Secondary | ICD-10-CM

## 2015-02-09 DIAGNOSIS — F329 Major depressive disorder, single episode, unspecified: Secondary | ICD-10-CM

## 2015-02-09 MED ORDER — ESCITALOPRAM OXALATE 10 MG PO TABS: 10.0000 mg | ORAL_TABLET | Freq: Every day | ORAL | Status: DC

## 2015-02-09 NOTE — Telephone Encounter (Signed)
Routed to Dr. Shah for approval 

## 2015-03-22 ENCOUNTER — Other Ambulatory Visit: Payer: Self-pay | Admitting: Family Medicine

## 2015-03-22 DIAGNOSIS — F329 Major depressive disorder, single episode, unspecified: Secondary | ICD-10-CM

## 2015-03-22 DIAGNOSIS — F32A Depression, unspecified: Secondary | ICD-10-CM

## 2015-09-28 ENCOUNTER — Ambulatory Visit: Payer: BC Managed Care – PPO | Admitting: Family Medicine

## 2015-09-29 ENCOUNTER — Ambulatory Visit (INDEPENDENT_AMBULATORY_CARE_PROVIDER_SITE_OTHER): Payer: BC Managed Care – PPO | Admitting: Family Medicine

## 2015-09-29 ENCOUNTER — Encounter: Payer: Self-pay | Admitting: Family Medicine

## 2015-09-29 VITALS — BP 120/73 | HR 115 | Temp 98.5°F | Resp 17 | Ht 64.0 in | Wt 195.8 lb

## 2015-09-29 DIAGNOSIS — F32A Depression, unspecified: Secondary | ICD-10-CM

## 2015-09-29 DIAGNOSIS — F329 Major depressive disorder, single episode, unspecified: Secondary | ICD-10-CM | POA: Diagnosis not present

## 2015-09-29 DIAGNOSIS — I10 Essential (primary) hypertension: Secondary | ICD-10-CM

## 2015-09-29 DIAGNOSIS — E785 Hyperlipidemia, unspecified: Secondary | ICD-10-CM

## 2015-09-29 DIAGNOSIS — F419 Anxiety disorder, unspecified: Secondary | ICD-10-CM

## 2015-09-29 DIAGNOSIS — E119 Type 2 diabetes mellitus without complications: Secondary | ICD-10-CM | POA: Diagnosis not present

## 2015-09-29 LAB — POCT GLYCOSYLATED HEMOGLOBIN (HGB A1C): HEMOGLOBIN A1C: 6.5

## 2015-09-29 LAB — GLUCOSE, POCT (MANUAL RESULT ENTRY): POC Glucose: 101 mg/dl — AB (ref 70–99)

## 2015-09-29 LAB — POCT UA - MICROALBUMIN
Albumin/Creatinine Ratio, Urine, POC: NEGATIVE
CREATININE, POC: NEGATIVE mg/dL
MICROALBUMIN (UR) POC: NEGATIVE mg/L

## 2015-09-29 MED ORDER — ALPRAZOLAM 0.5 MG PO TABS: 0.2500 mg | ORAL_TABLET | Freq: Three times a day (TID) | ORAL | Status: DC | PRN

## 2015-09-29 MED ORDER — VALSARTAN 80 MG PO TABS: 80.0000 mg | ORAL_TABLET | Freq: Every day | ORAL | Status: DC

## 2015-09-29 MED ORDER — ESCITALOPRAM OXALATE 10 MG PO TABS: 10.0000 mg | ORAL_TABLET | Freq: Every day | ORAL | Status: DC

## 2015-09-29 NOTE — Progress Notes (Signed)
Name: Madison Price   MRN: [id number]    DOB: 1960/05/30   Date:09/29/2015       Progress Note  Subjective  Chief Complaint  Chief Complaint  Patient presents with  . Medication Refill    Diabetes She presents for her follow-up diabetic visit. She has type 2 diabetes mellitus. Her disease course has been stable. Hypoglycemia symptoms include nervousness/anxiousness. Pertinent negatives for hypoglycemia include no headaches. Pertinent negatives for diabetes include no blurred vision and no chest pain. Pertinent negatives for diabetic complications include no CVA. When asked about current treatments, none (has not taken Metformin in the last 6 months) were reported. Frequency home blood tests: Pt. does not check her blood glucose.  Hyperlipidemia This is a chronic problem. The problem is uncontrolled. Recent lipid tests were reviewed and are high. Pertinent negatives include no chest pain. She is currently on no antihyperlipidemic treatment (Stopped taking Atorvastatin since October 2016).  Hypertension This is a chronic problem. The problem is controlled. Associated symptoms include anxiety. Pertinent negatives include no blurred vision, chest pain, headaches or palpitations. Past treatments include angiotensin blockers and diuretics. There is no history of kidney disease, CAD/MI or CVA.  Anxiety Presents for follow-up visit. The problem has been gradually worsening. Symptoms include excessive worry, irritability, muscle tension and nervous/anxious behavior. Patient reports no chest pain, depressed mood or palpitations. The severity of symptoms is moderate and causing significant distress. The symptoms are aggravated by family issues (Daughter who is considered a 'difficult child' is living with her and she is getting into arguments with her egularly, this is causing a great deal of stress).      Past Medical History  Diagnosis Date  . Anxiety   . Diabetes mellitus without  complication ([place])   . Hypertension   . Hyperlipidemia   . Depression     Past Surgical History  Procedure Laterality Date  . Cholecystectomy  2007  . Cesarean section      Family History  Problem Relation Age of Onset  . Schizophrenia Mother     paranoid  . Heart disease Father   . Cancer Sister     breast  . Cancer Maternal Aunt     colon    Social History   Social History  . Marital Status: Single    Spouse Name: N/A  . Number of Children: N/A  . Years of Education: N/A   Occupational History  . Not on file.   Social History Main Topics  . Smoking status: Never Smoker   . Smokeless tobacco: Never Used  . Alcohol Use: No  . Drug Use: No  . Sexual Activity: Not on file   Other Topics Concern  . Not on file   Social History Narrative     Current outpatient prescriptions:  .  ALPRAZolam (XANAX) 0.25 MG tablet, Take 1 tablet (0.25 mg total) by mouth 2 (two) times daily as needed for anxiety., Disp: 60 tablet, Rfl: 0 .  atorvastatin (LIPITOR) 20 MG tablet, Take 1 tablet (20 mg total) by mouth daily., Disp: 90 tablet, Rfl: 3 .  escitalopram (LEXAPRO) 10 MG tablet, Take 1 tablet (10 mg total) by mouth daily., Disp: 30 tablet, Rfl: 0 .  meloxicam (MOBIC) 7.5 MG tablet, , Disp: , Rfl:  .  metFORMIN (GLUCOPHAGE) 500 MG tablet, Take by mouth., Disp: , Rfl:  .  valsartan-hydrochlorothiazide (DIOVAN-HCT) 80-12.5 MG per tablet, Take 1 tablet by mouth daily., Disp: 30 tablet, Rfl: 0 .  Venlafaxine HCl  75 MG TB24, Take by mouth., Disp: , Rfl:   No Known Allergies   Review of Systems  Constitutional: Positive for irritability.  Eyes: Negative for blurred vision.  Cardiovascular: Negative for chest pain and palpitations.  Neurological: Negative for headaches.  Psychiatric/Behavioral: The patient is nervous/anxious.     Objective  Filed Vitals:   09/29/15 1148  BP: 120/73  Pulse: 115  Temp: 98.5 F (36.9 C)  TempSrc: Oral  Resp: 17  Height: 5\' 4"  (1.626 m)   Weight: 195 lb 12.8 oz (88.814 kg)  SpO2: 96%    Physical Exam  Constitutional: She is well-developed, well-nourished, and in no distress.  Cardiovascular: Regular rhythm, S1 normal and S2 normal.  Tachycardia present.   No murmur heard. Pulmonary/Chest: Breath sounds normal. She has no decreased breath sounds. She has no wheezes.  Abdominal: Soft. Bowel sounds are normal. There is no tenderness.  Psychiatric: Mood, memory, affect and judgment normal.  Nursing note and vitals reviewed.    Assessment & Plan  1. Diabetes mellitus without complication ([place]) Well-controlled diabetes with A1c of 6.5%, this is despite the fact that patient has not taken metformin in the last 9 months. DC metformin, continue on pharmacotherapy and recheck A1c in 3-4 months - POCT HgB A1C - POCT Glucose (CBG) - POCT UA - Microalbumin  2. Essential hypertension Blood pressure stable and controlled on no antihypertensive therapy as she has not taken Diovan-HCTZ in the last 9 months. We'll DC HCTZ, continue on low-dose Diovan for kidney protection as well as controlling blood pressure - valsartan (DIOVAN) 80 MG tablet; Take 1 tablet (80 mg total) by mouth daily.  Dispense: 90 tablet; Refill: 0  3. Anxiety Increase alprazolam to 0.25 mg 3 times daily as needed for anxiety - ALPRAZolam (XANAX) 0.5 MG tablet; Take 0.5 tablets (0.25 mg total) by mouth 3 (three) times daily as needed for anxiety.  Dispense: 90 tablet; Refill: 0  4. Hyperlipidemia  - Lipid Profile - Comprehensive Metabolic Panel (CMET)  5. Depression  - escitalopram (LEXAPRO) 10 MG tablet; Take 1 tablet (10 mg total) by mouth daily.  Dispense: 90 tablet; Refill: 0   Naomi Castrogiovanni [facility] 09/29/2015 11:55 AM

## 2016-01-02 ENCOUNTER — Encounter: Payer: Self-pay | Admitting: Family Medicine

## 2016-01-02 ENCOUNTER — Ambulatory Visit (INDEPENDENT_AMBULATORY_CARE_PROVIDER_SITE_OTHER): Payer: BC Managed Care – PPO | Admitting: Family Medicine

## 2016-01-02 VITALS — BP 118/77 | HR 94 | Temp 98.4°F | Resp 16 | Ht 64.0 in | Wt 187.1 lb

## 2016-01-02 DIAGNOSIS — F3342 Major depressive disorder, recurrent, in full remission: Secondary | ICD-10-CM

## 2016-01-02 DIAGNOSIS — I10 Essential (primary) hypertension: Secondary | ICD-10-CM

## 2016-01-02 DIAGNOSIS — E119 Type 2 diabetes mellitus without complications: Secondary | ICD-10-CM | POA: Diagnosis not present

## 2016-01-02 LAB — GLUCOSE, POCT (MANUAL RESULT ENTRY): POC GLUCOSE: 108 mg/dL — AB (ref 70–99)

## 2016-01-02 LAB — POCT GLYCOSYLATED HEMOGLOBIN (HGB A1C): HEMOGLOBIN A1C: 6

## 2016-01-02 MED ORDER — ESCITALOPRAM OXALATE 10 MG PO TABS: 10.0000 mg | ORAL_TABLET | Freq: Every day | ORAL | 0 refills | Status: DC

## 2016-01-02 MED ORDER — VALSARTAN-HYDROCHLOROTHIAZIDE 80-12.5 MG PO TABS: 1.0000 | ORAL_TABLET | Freq: Every day | ORAL | 0 refills | Status: DC

## 2016-01-02 NOTE — Progress Notes (Signed)
Name: Madison Price   MRN: [id number]    DOB: 1961/01/11   Date:01/02/2016       Progress Note  Subjective  Chief Complaint  Chief Complaint  Patient presents with  . Follow-up    3 mo    Diabetes  She presents for her follow-up diabetic visit. She has type 2 diabetes mellitus. Her disease course has been stable. Hypoglycemia symptoms include nervousness/anxiousness. Associated symptoms include polyuria. Pertinent negatives for diabetes include no blurred vision and no polydipsia. Pertinent negatives for diabetic complications include no CVA. Home blood sugar record trend: does not check her blood glucose at home. An ACE inhibitor/angiotensin II receptor blocker is being taken.  Hypertension  This is a chronic problem. The problem is unchanged. The problem is controlled. Associated symptoms include anxiety. Pertinent negatives include no blurred vision or palpitations. Past treatments include angiotensin blockers and diuretics. The current treatment provides significant improvement. There is no history of kidney disease, CAD/MI or CVA.  Anxiety  Presents for follow-up visit. Symptoms include excessive worry and nervous/anxious behavior. Patient reports no depressed mood, insomnia, irritability or palpitations.      Past Medical History:  Diagnosis Date  . Anxiety   . Depression   . Diabetes mellitus without complication ([place])   . Hyperlipidemia   . Hypertension     Past Surgical History:  Procedure Laterality Date  . CESAREAN SECTION    . CHOLECYSTECTOMY  2007    Family History  Problem Relation Age of Onset  . Schizophrenia Mother     paranoid  . Heart disease Father   . Cancer Sister     breast  . Cancer Maternal Aunt     colon    Social History   Social History  . Marital status: Single    Spouse name: N/A  . Number of children: N/A  . Years of education: N/A   Occupational History  . Not on file.   Social History Main Topics  . Smoking  status: Never Smoker  . Smokeless tobacco: Never Used  . Alcohol use No  . Drug use: No  . Sexual activity: Not on file   Other Topics Concern  . Not on file   Social History Narrative  . No narrative on file     Current Outpatient Prescriptions:  .  escitalopram (LEXAPRO) 10 MG tablet, Take 1 tablet (10 mg total) by mouth daily., Disp: 90 tablet, Rfl: 0 .  valsartan-hydrochlorothiazide (DIOVAN-HCT) 80-12.5 MG tablet, Take by mouth., Disp: , Rfl:   No Known Allergies   Review of Systems  Constitutional: Negative for irritability.  Eyes: Negative for blurred vision.  Cardiovascular: Negative for palpitations.  Endo/Heme/Allergies: Negative for polydipsia.  Psychiatric/Behavioral: The patient is nervous/anxious. The patient does not have insomnia.     Objective  Vitals:   01/02/16 0831  BP: 118/77  Pulse: 94  Resp: 16  Temp: 98.4 F (36.9 C)  TempSrc: Oral  SpO2: 97%  Weight: 187 lb 1.6 oz (84.9 kg)  Height: 5\' 4"  (1.626 m)    Physical Exam  Constitutional: She is oriented to person, place, and time and well-developed, well-nourished, and in no distress.  HENT:  Head: Normocephalic and atraumatic.  Cardiovascular: Normal rate, regular rhythm and normal heart sounds.   No murmur heard. Pulmonary/Chest: Effort normal and breath sounds normal. She has no wheezes. She has no rhonchi.  Abdominal: Soft. Bowel sounds are normal. There is no tenderness.  Musculoskeletal:       Right  ankle: She exhibits no swelling.       Left ankle: She exhibits no swelling.  Neurological: She is alert and oriented to person, place, and time.  Skin: Skin is warm and dry.  Psychiatric: Mood, memory, affect and judgment normal.  Nursing note and vitals reviewed.       Assessment & Plan  1. Recurrent major depressive disorder, in full remission ([place])  - escitalopram (LEXAPRO) 10 MG tablet; Take 1 tablet (10 mg total) by mouth daily.  Dispense: 90 tablet; Refill: 0  2.  Diabetes mellitus without complication (HCC) 123456 at 6.0%, diet-controlled diabetes - POCT HgB A1C - POCT Glucose (CBG)  3. Essential hypertension  - valsartan-hydrochlorothiazide (DIOVAN-HCT) 80-12.5 MG tablet; Take 1 tablet by mouth daily.  Dispense: 90 tablet; Refill: 0   Nikitta Sobiech [facility] 01/02/2016 8:52 AM

## 2016-01-03 LAB — LIPID PANEL
CHOLESTEROL TOTAL: 180 mg/dL (ref 100–199)
Chol/HDL Ratio: 4.9 ratio units — ABNORMAL HIGH (ref 0.0–4.4)
HDL: 37 mg/dL — ABNORMAL LOW (ref >39)
LDL Calculated: 120 mg/dL — ABNORMAL HIGH (ref 0–99)
Triglycerides: 113 mg/dL (ref 0–149)
VLDL CHOLESTEROL CAL: 23 mg/dL (ref 5–40)

## 2016-01-03 LAB — COMPREHENSIVE METABOLIC PANEL
A/G RATIO: 1.5 (ref 1.2–2.2)
ALT: 15 IU/L (ref 0–32)
AST: 13 IU/L (ref 0–40)
Albumin: 4.5 g/dL (ref 3.5–5.5)
Alkaline Phosphatase: 103 IU/L (ref 39–117)
BUN / CREAT RATIO: 25 — AB (ref 9–23)
BUN: 17 mg/dL (ref 6–24)
Bilirubin Total: 0.3 mg/dL (ref 0.0–1.2)
CO2: 27 mmol/L (ref 18–29)
Calcium: 9.8 mg/dL (ref 8.7–10.2)
Chloride: 98 mmol/L (ref 96–106)
Creatinine, Ser: 0.69 mg/dL (ref 0.57–1.00)
GFR, EST AFRICAN AMERICAN: 114 mL/min/{1.73_m2} (ref >59)
GFR, EST NON AFRICAN AMERICAN: 99 mL/min/{1.73_m2} (ref >59)
GLOBULIN, TOTAL: 3 g/dL (ref 1.5–4.5)
Glucose: 80 mg/dL (ref 65–99)
POTASSIUM: 4.2 mmol/L (ref 3.5–5.2)
SODIUM: 140 mmol/L (ref 134–144)
Total Protein: 7.5 g/dL (ref 6.0–8.5)

## 2016-02-08 ENCOUNTER — Encounter: Payer: Self-pay | Admitting: Family Medicine

## 2016-02-08 ENCOUNTER — Ambulatory Visit (INDEPENDENT_AMBULATORY_CARE_PROVIDER_SITE_OTHER): Payer: BC Managed Care – PPO | Admitting: Family Medicine

## 2016-02-08 DIAGNOSIS — M549 Dorsalgia, unspecified: Secondary | ICD-10-CM | POA: Diagnosis not present

## 2016-02-08 DIAGNOSIS — R200 Anesthesia of skin: Secondary | ICD-10-CM

## 2016-02-08 MED ORDER — GABAPENTIN 100 MG PO CAPS: 100.0000 mg | ORAL_CAPSULE | Freq: Three times a day (TID) | ORAL | 0 refills | Status: DC

## 2016-02-08 NOTE — Progress Notes (Signed)
Name: Madison Price   MRN: [id number]    DOB: Jul 26, 1960   Date:02/08/2016       Progress Note  Subjective  Chief Complaint  Chief Complaint  Patient presents with  . Annual Exam    HPI  Pt. Presents for evaluation of a 'numb spot' on her left middle back, soreness on the left side in general, started 3 weeks ago, initially felt that her bra strap was irritating that area, but now even without wearing the bra, its numb. Feels like it burns at night and the whole left side of the back feels tight and tense. Patient has been picking up heavy boxes at work   Past Medical History:  Diagnosis Date  . Anxiety   . Depression   . Diabetes mellitus without complication ([place])   . Hyperlipidemia   . Hypertension     Past Surgical History:  Procedure Laterality Date  . CESAREAN SECTION    . CHOLECYSTECTOMY  2007    Family History  Problem Relation Age of Onset  . Schizophrenia Mother     paranoid  . Heart disease Father   . Cancer Sister     breast  . Cancer Maternal Aunt     colon    Social History   Social History  . Marital status: Single    Spouse name: N/A  . Number of children: N/A  . Years of education: N/A   Occupational History  . Not on file.   Social History Main Topics  . Smoking status: Never Smoker  . Smokeless tobacco: Never Used  . Alcohol use No  . Drug use: No  . Sexual activity: Not on file   Other Topics Concern  . Not on file   Social History Narrative  . No narrative on file     Current Outpatient Prescriptions:  .  escitalopram (LEXAPRO) 10 MG tablet, Take 1 tablet (10 mg total) by mouth daily., Disp: 90 tablet, Rfl: 0 .  valsartan-hydrochlorothiazide (DIOVAN-HCT) 80-12.5 MG tablet, Take 1 tablet by mouth daily., Disp: 90 tablet, Rfl: 0  No Known Allergies   Review of Systems  Constitutional: Negative for chills, fever and malaise/fatigue.  Cardiovascular: Negative for chest pain.  Musculoskeletal: Positive for back  pain.  Neurological: Positive for tingling.    Objective  Vitals:   02/08/16 0903  BP: 124/86  Pulse: 94  Resp: 16  Temp: 98.6 F (37 C)  TempSrc: Oral  SpO2: 95%  Weight: 185 lb 4.8 oz (84.1 kg)  Height: 5\' 4"  (1.626 m)    Physical Exam  Constitutional: She is well-developed, well-nourished, and in no distress.  Musculoskeletal:  General stiffness in the left upper back  Skin:     An isolated area on the left middle back without sensation to light touch with monofilament.    Psychiatric: Memory, affect and judgment normal.  Nursing note and vitals reviewed.     Assessment & Plan  1. Back pain associated with peripheral numbness Likely associated muscle spasm, advised heating pad at night, may take gabapentin up to 3 times daily. If no relief, consider referral to neurology. - gabapentin (NEURONTIN) 100 MG capsule; Take 1 capsule (100 mg total) by mouth 3 (three) times daily.  Dispense: 45 capsule; Refill: 0   Kloie Whiting [facility] 02/08/2016 9:12 AM

## 2016-07-17 ENCOUNTER — Encounter: Payer: BC Managed Care – PPO | Admitting: Family Medicine

## 2016-07-20 ENCOUNTER — Ambulatory Visit (INDEPENDENT_AMBULATORY_CARE_PROVIDER_SITE_OTHER): Payer: Managed Care, Other (non HMO) | Admitting: Family Medicine

## 2016-07-20 ENCOUNTER — Other Ambulatory Visit: Payer: Self-pay | Admitting: Family Medicine

## 2016-07-20 ENCOUNTER — Encounter: Payer: Self-pay | Admitting: Family Medicine

## 2016-07-20 VITALS — BP 158/96 | HR 96 | Temp 97.7°F | Resp 16 | Ht 64.0 in | Wt 187.2 lb

## 2016-07-20 DIAGNOSIS — Z124 Encounter for screening for malignant neoplasm of cervix: Secondary | ICD-10-CM | POA: Diagnosis not present

## 2016-07-20 DIAGNOSIS — I10 Essential (primary) hypertension: Secondary | ICD-10-CM | POA: Diagnosis not present

## 2016-07-20 DIAGNOSIS — R7309 Other abnormal glucose: Secondary | ICD-10-CM | POA: Diagnosis not present

## 2016-07-20 DIAGNOSIS — Z0001 Encounter for general adult medical examination with abnormal findings: Secondary | ICD-10-CM | POA: Diagnosis not present

## 2016-07-20 DIAGNOSIS — Z1159 Encounter for screening for other viral diseases: Secondary | ICD-10-CM | POA: Diagnosis not present

## 2016-07-20 DIAGNOSIS — Z780 Asymptomatic menopausal state: Secondary | ICD-10-CM | POA: Diagnosis not present

## 2016-07-20 DIAGNOSIS — Z1239 Encounter for other screening for malignant neoplasm of breast: Secondary | ICD-10-CM

## 2016-07-20 DIAGNOSIS — Z01419 Encounter for gynecological examination (general) (routine) without abnormal findings: Secondary | ICD-10-CM | POA: Insufficient documentation

## 2016-07-20 DIAGNOSIS — N3281 Overactive bladder: Secondary | ICD-10-CM | POA: Diagnosis not present

## 2016-07-20 DIAGNOSIS — Z1231 Encounter for screening mammogram for malignant neoplasm of breast: Secondary | ICD-10-CM

## 2016-07-20 HISTORY — DX: Encounter for gynecological examination (general) (routine) without abnormal findings: Z01.419

## 2016-07-20 LAB — CBC WITH DIFFERENTIAL/PLATELET
BASOS ABS: 0 {cells}/uL (ref 0–200)
Basophils Relative: 0 %
EOS ABS: 162 {cells}/uL (ref 15–500)
Eosinophils Relative: 2 %
HEMATOCRIT: 42.4 % (ref 35.0–45.0)
HEMOGLOBIN: 13.9 g/dL (ref 11.7–15.5)
LYMPHS ABS: 2835 {cells}/uL (ref 850–3900)
Lymphocytes Relative: 35 %
MCH: 27.6 pg (ref 27.0–33.0)
MCHC: 32.8 g/dL (ref 32.0–36.0)
MCV: 84.3 fL (ref 80.0–100.0)
MPV: 10.3 fL (ref 7.5–12.5)
Monocytes Absolute: 567 cells/uL (ref 200–950)
Monocytes Relative: 7 %
NEUTROS PCT: 56 %
Neutro Abs: 4536 cells/uL (ref 1500–7800)
Platelets: 246 10*3/uL (ref 140–400)
RBC: 5.03 MIL/uL (ref 3.80–5.10)
RDW: 15.1 % — ABNORMAL HIGH (ref 11.0–15.0)
WBC: 8.1 10*3/uL (ref 3.8–10.8)

## 2016-07-20 LAB — TSH: TSH: 1.97 mIU/L

## 2016-07-20 LAB — POCT GLYCOSYLATED HEMOGLOBIN (HGB A1C): HEMOGLOBIN A1C: 5.8

## 2016-07-20 MED ORDER — SOLIFENACIN SUCCINATE 10 MG PO TABS: 10.0000 mg | ORAL_TABLET | Freq: Every day | ORAL | 0 refills | Status: DC

## 2016-07-20 MED ORDER — VALSARTAN-HYDROCHLOROTHIAZIDE 80-12.5 MG PO TABS: 1.0000 | ORAL_TABLET | Freq: Every day | ORAL | 0 refills | Status: DC

## 2016-07-20 NOTE — Progress Notes (Signed)
Name: Madison Price   MRN: [id number]    DOB: 05/28/1960   Date:07/20/2016       Progress Note  Subjective  Chief Complaint  Chief Complaint  Patient presents with  . Annual Exam  . Labs Only    hepatitis  . Medication Refill    vesicare  . Immunizations    ? pneumococcal    Hypertension  This is a chronic problem. The problem is unchanged. The problem is uncontrolled. Pertinent negatives include no blurred vision, chest pain, headaches, malaise/fatigue, orthopnea, palpitations or shortness of breath. Past treatments include angiotensin blockers and diuretics (has not taken medications for high BP in a while.). There is no history of kidney disease, CAD/MI or CVA.    Pt. Presents for Annual Physical exam.  Last colonoscopy was 3 years ago, was advised to return in 3-5 years per patient.  Last mammogram in May 2014, BI-RADS Cat.2 Last pap smear was performed by Gynecology. She will need DEXA Scan to check for Osteoporosis.   Past Medical History:  Diagnosis Date  . Anxiety   . Depression   . Diabetes mellitus without complication ([facility] Price)   . Hyperlipidemia   . Hypertension     Past Surgical History:  Procedure Laterality Date  . CESAREAN SECTION    . CHOLECYSTECTOMY  2007    Family History  Problem Relation Age of Onset  . Schizophrenia Mother     paranoid  . Heart disease Father   . Cancer Sister     breast  . Cancer Maternal Aunt     colon    Social History   Social History  . Marital status: Single    Spouse name: N/A  . Number of children: N/A  . Years of education: N/A   Occupational History  . Not on file.   Social History Main Topics  . Smoking status: Never Smoker  . Smokeless tobacco: Never Used  . Alcohol use No  . Drug use: No  . Sexual activity: No   Other Topics Concern  . Not on file   Social History Narrative  . No narrative on file     Current Outpatient Prescriptions:  .  escitalopram (LEXAPRO) 10 MG tablet,  Take 1 tablet (10 mg total) by mouth daily. (Patient not taking: Reported on 07/20/2016), Disp: 90 tablet, Rfl: 0 .  gabapentin (NEURONTIN) 100 MG capsule, Take 1 capsule (100 mg total) by mouth 3 (three) times daily. (Patient not taking: Reported on 07/20/2016), Disp: 45 capsule, Rfl: 0 .  valsartan-hydrochlorothiazide (DIOVAN-HCT) 80-12.5 MG tablet, Take 1 tablet by mouth daily. (Patient not taking: Reported on 07/20/2016), Disp: 90 tablet, Rfl: 0  No Known Allergies   Review of Systems  Constitutional: Negative for chills, fever and malaise/fatigue.  HENT: Negative for congestion, ear pain and sore throat.   Eyes: Negative for blurred vision.  Respiratory: Negative for shortness of breath.   Cardiovascular: Negative for chest pain, palpitations and orthopnea.  Gastrointestinal: Negative for abdominal pain, blood in stool, constipation, diarrhea, nausea and vomiting.  Genitourinary: Negative for dysuria and hematuria.  Musculoskeletal: Negative for back pain, joint pain and myalgias.  Neurological: Negative for dizziness and headaches.  Psychiatric/Behavioral: Positive for depression. The patient is not nervous/anxious and does not have insomnia.     Objective  Vitals:   07/20/16 0901  BP: (!) 158/96  Pulse: 96  Resp: 16  Temp: 97.7 F (36.5 C)  TempSrc: Oral  SpO2: 96%  Weight: 187 lb 3.2 oz (  84.9 kg)  Height: 5\' 4"  (1.626 m)    Physical Exam  Constitutional: She is oriented to person, place, and time and well-developed, well-nourished, and in no distress.  HENT:  Head: Normocephalic and atraumatic.  Right Ear: External ear normal.  Left Ear: External ear normal.  Cardiovascular: Normal rate, regular rhythm and normal heart sounds.   No murmur heard. Pulmonary/Chest: Effort normal and breath sounds normal. She has no wheezes. Right breast exhibits no mass, no nipple discharge, no skin change and no tenderness. Left breast exhibits no mass, no nipple discharge, no skin  change and no tenderness.  Abdominal: Soft. Bowel sounds are normal. There is no tenderness.  Genitourinary: Vagina normal, uterus normal, cervix normal, right adnexa normal and left adnexa normal. Right adnexum displays no mass and no tenderness. Left adnexum displays no mass and no tenderness. Vagina exhibits no lesion.  Musculoskeletal: She exhibits no edema.  Neurological: She is alert and oriented to person, place, and time.  Skin: Skin is warm and dry.  Psychiatric: Mood, memory, affect and judgment normal.  Nursing note and vitals reviewed.       Assessment & Plan  1. Well woman exam with routine gynecological exam  - CBC with Differential/Platelet - TSH - VITAMIN D 25 Hydroxy (Vit-D Deficiency, Fractures)  2. Screening for breast cancer  - MM Digital Screening; Future  3. Essential hypertension She has not taken valsartan-hydrochlorothiazide for some time, advised to resume the medication as prescribed and a new refill sent to pharmacy, reassess in one month - valsartan-hydrochlorothiazide (DIOVAN-HCT) 80-12.5 MG tablet; Take 1 tablet by mouth daily.  Dispense: 90 tablet; Refill: 0  4. Post-menopausal  - DG Bone Density; Future  5. Need for hepatitis C screening test - Hepatitis C antibody  6. Elevated hemoglobin A1c  A1c is 5.8%, improved from 6.0% from 6 months ago. - POCT glycosylated hemoglobin (Hb A1C)  7. OAB (overactive bladder)  - solifenacin (VESICARE) 10 MG tablet; Take 1 tablet (10 mg total) by mouth daily.  Dispense: 90 tablet; Refill: 0  8. Cervical cancer screening  - Pap IG and HPV (high risk) DNA detection  7. OAB (overactive bladder) Patient has taken Vesicare in the past for urinary urgency, was diagnosed with overactive bladder by previous PCP and has taken Vesicare 10 mg, will provide a 3 month supply of Vesicare, and educated on potential side effects, follow-up if any problems  - solifenacin (VESICARE) 10 MG tablet; Take 1 tablet (10  mg total) by mouth daily.  Dispense: 90 tablet; Refill: 0   [facility] 07/20/2016 9:10 AM

## 2016-07-21 LAB — HEPATITIS C ANTIBODY: HCV Ab: NEGATIVE

## 2016-07-21 LAB — VITAMIN D 25 HYDROXY (VIT D DEFICIENCY, FRACTURES): Vit D, 25-Hydroxy: 21 ng/mL — ABNORMAL LOW (ref 30–100)

## 2016-07-24 ENCOUNTER — Telehealth: Payer: Self-pay

## 2016-07-24 MED ORDER — VITAMIN D (ERGOCALCIFEROL) 1.25 MG (50000 UNIT) PO CAPS: 50000.0000 [IU] | ORAL_CAPSULE | ORAL | 0 refills | Status: DC

## 2016-07-24 NOTE — Telephone Encounter (Signed)
Patient has been notified of lab results and a prescription for vitamin D3 50,000 units take 1 capsule once a week x12 weeks has been sent to [facility] per Dr. Manuella Ghazi, patient has been notified and verbalized understanding

## 2016-07-25 LAB — PAP IG AND HPV HIGH-RISK: HPV DNA HIGH RISK: DETECTED — AB

## 2016-07-26 ENCOUNTER — Other Ambulatory Visit: Payer: Self-pay | Admitting: Family Medicine

## 2016-07-26 DIAGNOSIS — R87810 Cervical high risk human papillomavirus (HPV) DNA test positive: Secondary | ICD-10-CM

## 2016-08-01 ENCOUNTER — Telehealth: Payer: Self-pay | Admitting: Obstetrics & Gynecology

## 2016-08-01 NOTE — Telephone Encounter (Signed)
Pt is being referred by [facility] for Pap smear of cervix shows high risk HPV present. I called and left voicemail for pt to call to schedule appt for [place].

## 2016-08-02 NOTE — Telephone Encounter (Signed)
Pt is schedule 11/14/16 with Dr. Georgianne Fick

## 2016-10-09 ENCOUNTER — Telehealth: Payer: Self-pay | Admitting: Family Medicine

## 2016-10-09 NOTE — Telephone Encounter (Signed)
Pt has seen news about the problem with valsartan and wants to now what she needs to do. Does she keep taking or replace it with something else.

## 2016-10-09 NOTE — Telephone Encounter (Signed)
Patient has seen on local news that according to research, valsartan and hydrochlorothiazide-type drugs can cause cancer and she wanted to discuss with me. I informed her that I'm not aware of that research but will look into it. She has an upcoming appointment on July 27th and we'll discuss at that time.

## 2016-10-10 NOTE — Telephone Encounter (Signed)
LFT MESSAGE

## 2016-10-19 ENCOUNTER — Ambulatory Visit: Payer: Managed Care, Other (non HMO) | Admitting: Family Medicine

## 2016-11-06 ENCOUNTER — Ambulatory Visit
Admission: RE | Admit: 2016-11-06 | Discharge: 2016-11-06 | Disposition: A | Discharge status: Home / Self Care | Payer: Self-pay | Source: Ambulatory Visit | Attending: Family Medicine | Admitting: Family Medicine

## 2016-11-06 ENCOUNTER — Encounter: Payer: Self-pay | Admitting: Family Medicine

## 2016-11-06 ENCOUNTER — Telehealth: Payer: Self-pay | Admitting: Family Medicine

## 2016-11-06 ENCOUNTER — Ambulatory Visit (INDEPENDENT_AMBULATORY_CARE_PROVIDER_SITE_OTHER): Payer: Managed Care, Other (non HMO) | Admitting: Family Medicine

## 2016-11-06 VITALS — BP 158/94 | HR 94 | Temp 97.6°F | Resp 16 | Ht 64.0 in | Wt 195.1 lb

## 2016-11-06 DIAGNOSIS — R61 Generalized hyperhidrosis: Secondary | ICD-10-CM | POA: Insufficient documentation

## 2016-11-06 DIAGNOSIS — I10 Essential (primary) hypertension: Secondary | ICD-10-CM | POA: Insufficient documentation

## 2016-11-06 DIAGNOSIS — M549 Dorsalgia, unspecified: Secondary | ICD-10-CM

## 2016-11-06 DIAGNOSIS — R42 Dizziness and giddiness: Secondary | ICD-10-CM

## 2016-11-06 DIAGNOSIS — F3342 Major depressive disorder, recurrent, in full remission: Secondary | ICD-10-CM

## 2016-11-06 DIAGNOSIS — R110 Nausea: Secondary | ICD-10-CM

## 2016-11-06 DIAGNOSIS — R200 Anesthesia of skin: Secondary | ICD-10-CM

## 2016-11-06 MED ORDER — ESCITALOPRAM OXALATE 10 MG PO TABS: 10.0000 mg | ORAL_TABLET | Freq: Every day | ORAL | 0 refills | Status: AC

## 2016-11-06 MED ORDER — GABAPENTIN 100 MG PO CAPS: 100.0000 mg | ORAL_CAPSULE | Freq: Three times a day (TID) | ORAL | 0 refills | Status: AC

## 2016-11-06 MED ORDER — VALSARTAN-HYDROCHLOROTHIAZIDE 80-12.5 MG PO TABS: 1.0000 | ORAL_TABLET | Freq: Every day | ORAL | 0 refills | Status: DC

## 2016-11-06 NOTE — Patient Instructions (Addendum)
-   CT Scan of your head is scheduled for 2:00 today at [facility] hospital. Please arrive at 1:45. - Please purchase Meclizine OTC and take as directed - you may take either 12.5mg  or 25mg  of this medication, it may make you drowsy, please do not drive or do any activities requiring concentration while taking this medication. - Follow up with Dr. Manuella Ghazi in 2 weeks, sooner depending on CT scan results. - If you have any signs or symptoms of stroke, or if your symptoms do not resolve or worsen, please go directly to the Emergency Department.

## 2016-11-06 NOTE — Telephone Encounter (Signed)
Pt was prescribed valsartin and the pharmacy is calling to inform you that it is being pulled off the shelf. Please prescribed something different and sent to [facility]. Pt is there waiting (P) [phone]

## 2016-11-06 NOTE — Progress Notes (Addendum)
Name: Madison Price   MRN: [id number]    DOB: May 27, 1960   Date:11/06/2016       Progress Note  Subjective  Chief Complaint  Chief Complaint  Patient presents with  . Dizziness    nausea since last night    HPI  Pt reports waking to use restroom at 0200 today, became very diaphoretic and cold, very nauseous with dry heaving, extremely dizzy like the room was spinning and had to hold on to the wall to walk down the hall - her daughter had to help her to her couch.  She fell back asleep around 0330 - laying on right side no dizziness, laying on LEFT side, dizziness resumed.  PT awoke at 0700, didn't feel 100% but was able to get ready and drive to work.  At 1015 patient was at work she had another episode of sudden onset diaphoresis, chills, and significant nausea - no dry heaves, only mild dizziness during this episode - this lasted for about 1110. Drove herself to the clinic today to be seen.  Also notes that she has had a mild intermittent headache that began this morning.  Negative for: Confusion, slurred speech, weakness, facial droop, gait disturbance aside from dizziness causing her to hold on to walls as described above, no visual disturbance.  Patient has been out of all prescription medications since June because she does not have insurance coverage in the summer months. Discussed [place] for coupons for some of her medications. We will refill Gabapentin 100mg  TID, Lexapro 10mg  (to be weaned 5mg  x2 weeks, then 10mg ), and Diovan HCT.  HTN: BP elevated today, out of meds as above. We will restart today and schedule close follow up with Dr. Manuella Ghazi PCP. No BLE edema, chest pain, shortness of breath. See HPI above for dizziness and headache related symptoms.  Cervical Spine Pain: Has seen ortho in the past, has chronic neck pain and was told it is related to "years and years of arthritis". She gets numbness and tingling occasionally in her RIGHT hand. She states gabapentin does work  well when she has it.  We will refill today, and she will follow up with Dr. Manuella Ghazi very closely.  Depression and Anxiety: Mostly situational; her daughter has been home for the summer and she was doing a lot better. Daughter leaves in a few days to go back to school (Goes to [name] for psychology), she has another daughter who is an Garment/textile technologist for [employer] and this causes increased anxiety. She has done very well on lexapro in the past and wants to be re-started - we discussed a slow taper - taking 5mg  x14 days, then going to 10mg .  Close follow up with Dr. Manuella Ghazi to be scheduled.   Patient Active Problem List   Diagnosis Date Noted  . Pap smear of cervix shows high risk HPV present 07/26/2016  . Well woman exam with routine gynecological exam 07/20/2016  . OAB (overactive bladder) 07/20/2016  . Back pain associated with peripheral numbness 02/08/2016  . Hypertension 11/18/2014  . Diabetes mellitus without complication ([place]) 34/74/2595  . Depression 11/18/2014  . Hyperlipidemia 11/18/2014  . Anxiety 11/18/2014    Social History  Substance Use Topics  . Smoking status: Never Smoker  . Smokeless tobacco: Never Used  . Alcohol use No     Current Outpatient Prescriptions:  .  escitalopram (LEXAPRO) 10 MG tablet, Take 1 tablet (10 mg total) by mouth daily., Disp: 30 tablet, Rfl: 0 .  gabapentin (  NEURONTIN) 100 MG capsule, Take 1 capsule (100 mg total) by mouth 3 (three) times daily., Disp: 90 capsule, Rfl: 0 .  solifenacin (VESICARE) 10 MG tablet, Take 1 tablet (10 mg total) by mouth daily. (Patient not taking: Reported on 11/06/2016), Disp: 90 tablet, Rfl: 0 .  valsartan-hydrochlorothiazide (DIOVAN-HCT) 80-12.5 MG tablet, Take 1 tablet by mouth daily., Disp: 30 tablet, Rfl: 0 .  Vitamin D, Ergocalciferol, (DRISDOL) 50000 units CAPS capsule, Take 1 capsule (50,000 Units total) by mouth once a week. For 12 weeks (Patient not taking: Reported on 11/06/2016), Disp: 12 capsule, Rfl:  0  No Known Allergies  ROS  Ten systems reviewed and is negative except as mentioned in HPI  Objective  Vitals:   11/06/16 1059  BP: (!) 158/94  Pulse: 94  Resp: 16  Temp: 97.6 F (36.4 C)  TempSrc: Oral  SpO2: 95%  Weight: 195 lb 1.6 oz (88.5 kg)  Height: 5\' 4"  (1.626 m)    Depression screen [facility] 2/9 07/20/2016 01/02/2016 09/29/2015 12/01/2014 11/18/2014  Decreased Interest 0 0 0 0 0  Down, Depressed, Hopeless 0 0 0 0 0  PHQ - 2 Score 0 0 0 0 0    Body mass index is 33.49 kg/m.  Nursing Note and Vital Signs reviewed.  Physical Exam  Constitutional: Patient appears well-developed and well-nourished. Obese No distress.  HEENT: head atraumatic, normocephalic, pupils equal and reactive to light, EOM's intact,  TM's without erythema or bulging, no maxillary or frontal sinus pain on palpation, neck supple without lymphadenopathy, oropharynx pink and moist without exudate. Cardiovascular: Normal rate, regular rhythm, S1/S2 present.  No murmur or rub heard. No BLE edema. Pulmonary/Chest: Effort normal and breath sounds clear. No respiratory distress or retractions. Abdominal: Soft and non-tender, bowel sounds present x4 quadrants. Psychiatric: Patient has a normal mood and affect. behavior is normal. Judgment and thought content normal. Musculoskeletal: Normal range of motion, no joint effusions. No gross deformities. Neurological: she is alert and oriented to person, place, and time. No cranial nerve deficit, negative romberg, no dysdiadochokinesia. Face symmetric, shoulder shrug strong and symmetric; no limb ataxia, sensation equal bilaterally.  Coordination, balance, strength, speech and gait are WNL and equal bilaterally.  +Dix hall pike right-sided only - pt w/ nystagmus, nausea, and some mild diaphoresis - recovered after sitting up and relaxing for a few moments. Skin: Skin is warm and dry. No rash noted. No erythema.  Psychiatric: Patient has a normal mood and affect. behavior  is normal. Judgment and thought content normal.  No results found for this or any previous visit (from the past 2160 hour(s)).  Assessment & Plan  1. Dizziness, nonspecific - CT Head Wo Contrast; Future - Discussed need to rule out acute hemorrhagic or ischemic stroke with CT head, and patient is in agreement - scheduled for 2:00 today. Instructions given to patient. - If CT negative, advised patient to re-start HTN medications, purchase OTC Meclizine and take PRN, and take today and tomorrow off to rest.  Home Epley maneuvers discussed and handout is provided for patient to do at home.  2. Essential hypertension - valsartan-hydrochlorothiazide (DIOVAN-HCT) 80-12.5 MG tablet; Take 1 tablet by mouth daily.  Dispense: 30 tablet; Refill: 0 - CT Head Wo Contrast; Future  3. Back pain associated with peripheral numbness - gabapentin (NEURONTIN) 100 MG capsule; Take 1 capsule (100 mg total) by mouth 3 (three) times daily.  Dispense: 90 capsule; Refill: 0  4. Recurrent major depressive disorder, in full remission ([place]) - escitalopram (  LEXAPRO) 10 MG tablet; Take 1 tablet (10 mg total) by mouth daily.  Dispense: 30 tablet; Refill: 0  5. Diaphoresis - CT Head Wo Contrast; Future  6. Nausea - CT Head Wo Contrast; Future - Meclizine OTC  Return for 1-2 week follow up with Dr. Manuella Ghazi.  -Red flags and when to present for emergency care or RTC including weakness, facial droop, speech or visual disturbance, tactile disturbance, recurrence of symptoms despite treatment, new/worsening/un-resolving symptoms, reviewed with patient in great detail at time of visit.  She is also advised to discuss stroke symptom recognition with those who live and work with her so that they know the red flags for when to seek EC.  Follow up and care instructions discussed and provided in AVS.  I have reviewed this encounter including the documentation in this note and/or discussed this patient with the Johney Maine,  FNP, NP-C. I am certifying that I agree with the content of this note as supervising physician.  Steele Sizer, MD [facility] 11/06/2016, 5:05 PM

## 2016-11-08 ENCOUNTER — Telehealth: Payer: Self-pay | Admitting: Family Medicine

## 2016-11-08 NOTE — Telephone Encounter (Signed)
Called to check in on patient - she is feeling significantly better. Has been performing epley maneuvers as discussed and states this has helped immensely. She will keep her 2 week follow up with PCP Dr. Manuella Ghazi.

## 2016-11-14 ENCOUNTER — Ambulatory Visit: Payer: Self-pay | Admitting: Obstetrics and Gynecology

## 2016-11-14 ENCOUNTER — Ambulatory Visit: Payer: Self-pay

## 2016-11-16 ENCOUNTER — Ambulatory Visit: Payer: Self-pay | Admitting: Obstetrics and Gynecology

## 2016-11-20 ENCOUNTER — Ambulatory Visit: Payer: Self-pay | Admitting: Family Medicine

## 2016-12-14 ENCOUNTER — Encounter: Payer: Self-pay | Admitting: Obstetrics & Gynecology

## 2016-12-14 ENCOUNTER — Ambulatory Visit (INDEPENDENT_AMBULATORY_CARE_PROVIDER_SITE_OTHER): Payer: 59 | Admitting: Obstetrics & Gynecology

## 2016-12-14 VITALS — BP 130/90 | HR 66 | Ht 64.0 in | Wt 193.0 lb

## 2016-12-14 DIAGNOSIS — R87810 Cervical high risk human papillomavirus (HPV) DNA test positive: Secondary | ICD-10-CM

## 2016-12-14 NOTE — Patient Instructions (Signed)

## 2016-12-14 NOTE — Progress Notes (Signed)
Referring Provider:  Keith Rake, MD  HPI:  Madison Price is a 56 y.o.  [phone]  who presents today for evaluation and management of abnormal cervical cytology.    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  OB History  Gravida Para Term Preterm AB Living  2 2   2   2   SAB TAB Ectopic Multiple Live Births               # Outcome Date GA Lbr Len/2nd Weight Sex Delivery Anes PTL Lv  2 Preterm           1 Preterm               Past Medical History:  Diagnosis Date  . Anxiety   . Depression   . Diabetes mellitus without complication ([place])   . Hyperlipidemia   . Hypertension     Past Surgical History:  Procedure Laterality Date  . CESAREAN SECTION    . CHOLECYSTECTOMY  2007    SOCIAL HISTORY: History  Alcohol Use No   History  Drug Use No     Family History  Problem Relation Age of Onset  . Schizophrenia Mother        paranoid  . Heart disease Father   . Cancer Sister        breast  . Cancer Maternal Aunt        colon    ALLERGIES:  Patient has no known allergies.  Current Outpatient Prescriptions on File Prior to Visit  Medication Sig Dispense Refill  . escitalopram (LEXAPRO) 10 MG tablet Take 1 tablet (10 mg total) by mouth daily. 30 tablet 0  . gabapentin (NEURONTIN) 100 MG capsule Take 1 capsule (100 mg total) by mouth 3 (three) times daily. 90 capsule 0  . solifenacin (VESICARE) 10 MG tablet Take 1 tablet (10 mg total) by mouth daily. (Patient not taking: Reported on 11/06/2016) 90 tablet 0  . valsartan-hydrochlorothiazide (DIOVAN-HCT) 80-12.5 MG tablet Take 1 tablet by mouth daily. 30 tablet 0  . Vitamin D, Ergocalciferol, (DRISDOL) 50000 units CAPS capsule Take 1 capsule (50,000 Units total) by mouth once a week. For 12 weeks (Patient not taking: Reported on 11/06/2016) 12 capsule 0   No current facility-administered medications on file prior to visit.     Physical  Exam: -Vitals:  BP 130/90   Pulse 66   Ht 5\' 4"  (1.626 m)   Wt 193 lb (87.5 kg)   LMP  (LMP Unknown)   BMI 33.13 kg/m  GEN: WD, WN, NAD.  A+ O x 3, good mood and affect. ABD:  NT, ND.  Soft, no masses.  No hernias noted.   Pelvic:   Vulva: Normal appearance.  No lesions.  Vagina: No lesions or abnormalities noted.  Support: Normal pelvic support.  Urethra No masses tenderness or scarring.  Meatus Normal size without lesions or prolapse.  Cervix: See below.  Anus: Normal exam.  No lesions.  Perineum: Normal exam.  No lesions.        Bimanual   Uterus: Normal size.  Non-tender.  Mobile.  AV.  Adnexae: No masses.  Non-tender to palpation.  Cul-de-sac: Negative for abnormality.   PROCEDURE: 1.  Urine Pregnancy Test:  not done 2.  Colposcopy performed with 4% acetic acid after verbal consent obtained                                         -  Aceto-white Lesions Location(s): none.              -Biopsy performed at none               -ECC indicated and performed: Yes.       -Biopsy sites made hemostatic with pressure, AgNO3, and/or Monsel's solution   -Satisfactory colposcopy: Yes.      -Evidence of Invasive cervical CA :  NO  ASSESSMENT:  Madison Price is a 56 y.o. [id number] here for  1. Cervical high risk human papillomavirus (HPV) DNA test positive   .  PLAN: 1.  I discussed the grading system of pap smears and HPV high risk viral types.  We will discuss and base management after colpo results return. 2. Follow up PAP 6 months, vs intervention if high grade dysplasia identified 3. Treatment of persistantly abnormal PAP smears and cervical dysplasia, even mild, is discussed w pt today in detail, as well as the pros and cons of Cryo and LEEP procedures. Will consider and discuss after results. 4. PAP and ECC done today     Barnett Applebaum, MD, [facility], [facility] 12/14/2016  2:55 PM

## 2016-12-17 LAB — PATHOLOGY

## 2016-12-18 LAB — PAP IG (IMAGE GUIDED): PAP Smear Comment: 0

## 2017-03-28 ENCOUNTER — Ambulatory Visit: Payer: Self-pay | Admitting: Family Medicine

## 2017-04-01 ENCOUNTER — Ambulatory Visit (INDEPENDENT_AMBULATORY_CARE_PROVIDER_SITE_OTHER): Payer: 59 | Admitting: Family Medicine

## 2017-04-01 ENCOUNTER — Encounter: Payer: Self-pay | Admitting: Family Medicine

## 2017-04-01 VITALS — BP 146/88 | HR 88 | Temp 98.0°F | Resp 14 | Ht 64.0 in | Wt 187.6 lb

## 2017-04-01 DIAGNOSIS — E7800 Pure hypercholesterolemia, unspecified: Secondary | ICD-10-CM | POA: Diagnosis not present

## 2017-04-01 DIAGNOSIS — L700 Acne vulgaris: Secondary | ICD-10-CM | POA: Diagnosis not present

## 2017-04-01 DIAGNOSIS — I10 Essential (primary) hypertension: Secondary | ICD-10-CM | POA: Diagnosis not present

## 2017-04-01 MED ORDER — BENZOYL PEROXIDE-ERYTHROMYCIN 5-3 % EX GEL: Freq: Two times a day (BID) | CUTANEOUS | 0 refills | Status: AC

## 2017-04-01 MED ORDER — TELMISARTAN-HCTZ 40-12.5 MG PO TABS
1.0000 | ORAL_TABLET | Freq: Every day | ORAL | 0 refills | Status: DC
Start: 2017-04-01 — End: 2017-07-15

## 2017-04-01 NOTE — Progress Notes (Signed)
Name: Madison Price   MRN: [id number]    DOB: [birth year]   Date:04/01/2017       Progress Note  Subjective  Chief Complaint  Chief Complaint  Patient presents with  . Acne    left side of face that want go away since jan 2018. pt used everything and did not go away but started using natural soap and its mild now  . medication mangement    Valsarta-hctz recalled need a new Bp meds. Has not had her Bp meds in a month. Pt denies any issue    Hypertension  This is a chronic problem. The problem is unchanged. The problem is uncontrolled (she has not taken taken Valsartan-HCTZ). Pertinent negatives include no blurred vision, chest pain, headaches or palpitations. Past treatments include angiotensin blockers and diuretics. There is no history of kidney disease or CAD/MI.    Pt. Has a rash on the left side of her face, present since January 2018, it consists of whiteheads, reddish appearing rash and her skin feels oily to touch. She has tried [employer], [employer] etc. Recently, she switched to an all-natural soap which seems to be helping clear up the rash.    Past Medical History:  Diagnosis Date  . Anxiety   . Depression   . Diabetes mellitus without complication ([place])   . Hyperlipidemia   . Hypertension     Past Surgical History:  Procedure Laterality Date  . CESAREAN SECTION    . CHOLECYSTECTOMY  2007    Family History  Problem Relation Age of Onset  . Schizophrenia Mother        paranoid  . Heart disease Father   . Cancer Sister        breast  . Cancer Maternal Aunt        colon    Social History   Socioeconomic History  . Marital status: Single    Spouse name: Not on file  . Number of children: Not on file  . Years of education: Not on file  . Highest education level: Not on file  Social Needs  . Financial resource strain: Not on file  . Food insecurity - worry: Not on file  . Food insecurity - inability: Not on file  . Transportation  needs - medical: Not on file  . Transportation needs - non-medical: Not on file  Occupational History  . Not on file  Tobacco Use  . Smoking status: Never Smoker  . Smokeless tobacco: Never Used  Substance and Sexual Activity  . Alcohol use: No    Alcohol/week: 0.0 oz  . Drug use: No  . Sexual activity: No  Other Topics Concern  . Not on file  Social History Narrative  . Not on file     Current Outpatient Medications:  .  escitalopram (LEXAPRO) 10 MG tablet, Take 1 tablet (10 mg total) by mouth daily. (Patient not taking: Reported on 04/01/2017), Disp: 30 tablet, Rfl: 0 .  gabapentin (NEURONTIN) 100 MG capsule, Take 1 capsule (100 mg total) by mouth 3 (three) times daily. (Patient not taking: Reported on 04/01/2017), Disp: 90 capsule, Rfl: 0 .  solifenacin (VESICARE) 10 MG tablet, Take 1 tablet (10 mg total) by mouth daily. (Patient not taking: Reported on 11/06/2016), Disp: 90 tablet, Rfl: 0 .  valsartan-hydrochlorothiazide (DIOVAN-HCT) 80-12.5 MG tablet, Take 1 tablet by mouth daily. (Patient not taking: Reported on 04/01/2017), Disp: 30 tablet, Rfl: 0 .  Vitamin D, Ergocalciferol, (DRISDOL) 50000 units CAPS capsule,  Take 1 capsule (50,000 Units total) by mouth once a week. For 12 weeks (Patient not taking: Reported on 11/06/2016), Disp: 12 capsule, Rfl: 0  No Known Allergies   Review of Systems  Eyes: Negative for blurred vision.  Cardiovascular: Negative for chest pain and palpitations.  Neurological: Negative for headaches.      Objective  Vitals:   04/01/17 0850  BP: (!) 146/88  Pulse: 88  Resp: 14  Temp: 98 F (36.7 C)  TempSrc: Oral  SpO2: 99%  Weight: 187 lb 9.6 oz (85.1 kg)  Height: 5\' 4"  (1.626 m)    Physical Exam  Constitutional: She is oriented to person, place, and time and well-developed, well-nourished, and in no distress.  HENT:  Head: Normocephalic and atraumatic.  Cardiovascular: Normal rate, regular rhythm and normal heart sounds.  No murmur  heard. Pulmonary/Chest: Effort normal and breath sounds normal. She has no wheezes. She has no rhonchi.  Abdominal: Soft. Bowel sounds are normal. There is no tenderness.  Musculoskeletal:       Right ankle: She exhibits no swelling.       Left ankle: She exhibits no swelling.  Neurological: She is alert and oriented to person, place, and time.  Skin: Skin is warm and dry. Rash noted. Rash is maculopapular. Rash is not pustular.     maculo-papular pustular hyperpigmented rash on the elft side of face, c/w acne vulgaris.   Psychiatric: Mood, memory, affect and judgment normal.  Nursing note and vitals reviewed.     Assessment & Plan  1. Superficial inflammatory acne vulgaris Will start on benzoyl peroxide and erythromycin ointment for treatment of infection, advised to use a gentle face lotion for - benzoyl peroxide-erythromycin (BENZAMYCIN) gel; Apply topically 2 (two) times daily for 14 days.  Dispense: 46.6 g; Refill: 0  2. Essential hypertension Changed to my card his HCTZ for blood pressure, recheck in 3 months. - telmisartan-hydrochlorothiazide (MICARDIS HCT) 40-12.5 MG tablet; Take 1 tablet by mouth daily.  Dispense: 90 tablet; Refill: 0  3. Pure hypercholesterolemia Repeat FLP and consider starting on statin - Lipid panel     Shyheem Whitham [facility] 04/01/2017 9:04 AM

## 2017-05-06 ENCOUNTER — Telehealth: Payer: Self-pay | Admitting: Family Medicine

## 2017-05-06 NOTE — Telephone Encounter (Signed)
Pt states she cannot afford to come in at this time and will just wait

## 2017-05-06 NOTE — Telephone Encounter (Signed)
If she can come in for an appointment to evaluate her symptoms, she can be given excuse if indicated.

## 2017-05-06 NOTE — Telephone Encounter (Signed)
Copied from [place]. Topic: Quick Communication - See Telephone Encounter >> May 06, 2017  2:40 PM Robina Ade, Helene Kelp D wrote: CRM for notification. See Telephone encounter for: 05/06/17. Patient called and said that she thinks she has the flu and did not go to work today and wanted to know if she can have an excuse not for today. Please call patient back, thanks.

## 2017-05-07 ENCOUNTER — Ambulatory Visit (INDEPENDENT_AMBULATORY_CARE_PROVIDER_SITE_OTHER): Payer: 59 | Admitting: Family Medicine

## 2017-05-07 ENCOUNTER — Encounter: Payer: Self-pay | Admitting: Family Medicine

## 2017-05-07 VITALS — BP 128/72 | HR 94 | Temp 98.2°F | Resp 14 | Wt 193.1 lb

## 2017-05-07 DIAGNOSIS — R112 Nausea with vomiting, unspecified: Secondary | ICD-10-CM | POA: Diagnosis not present

## 2017-05-07 NOTE — Progress Notes (Signed)
Name: Madison Price   MRN: [id number]    DOB: 1960/05/04   Date:05/07/2017       Progress Note  Subjective  Chief Complaint  Chief Complaint  Patient presents with  . Vomiting    onset yesterday  . Nausea    onset yesterdauy  . Generalized Body Aches    aching and constant discomfort; onset yesterday   . Fatigue    onset yesterday   . Headache    Emesis   This is a new problem. The current episode started yesterday. There has been no fever. Associated symptoms include myalgias. Pertinent negatives include no abdominal pain, chills, diarrhea, fever or URI. 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 She has tried increased fluids for the symptoms.      Past Medical History:  Diagnosis Date  . Anxiety   . Depression   . Diabetes mellitus without complication ([place])   . Hyperlipidemia   . Hypertension     Past Surgical History:  Procedure Laterality Date  . CESAREAN SECTION    . CHOLECYSTECTOMY  2007    Family History  Problem Relation Age of Onset  . Schizophrenia Mother        paranoid  . Heart disease Father   . Cancer Sister        breast  . Cancer Maternal Aunt        colon    Social History   Socioeconomic History  . Marital status: Single    Spouse name: Not on file  . Number of children: Not on file  . Years of education: Not on file  . Highest education level: Not on file  Social Needs  . Financial resource strain: Not on file  . Food insecurity - worry: Not on file  . Food insecurity - inability: Not on file  . Transportation needs - medical: Not on file  . Transportation needs - non-medical: Not on file  Occupational History  . Not on file  Tobacco Use  . Smoking status: Never Smoker  . Smokeless tobacco: Never Used  Substance and Sexual Activity  . Alcohol use: No    Alcohol/week: 0.0 oz  . Drug use: No  . Sexual activity: No  Other  Topics Concern  . Not on file  Social History Narrative  . Not on file     Current Outpatient Medications:  .  meclizine (ANTIVERT) 25 MG tablet, Take 25 mg by mouth 3 (three) times daily as needed for dizziness., Disp: , Rfl:  .  telmisartan-hydrochlorothiazide (MICARDIS HCT) 40-12.5 MG tablet, Take 1 tablet by mouth daily., Disp: 90 tablet, Rfl: 0 .  escitalopram (LEXAPRO) 10 MG tablet, Take 1 tablet (10 mg total) by mouth daily. (Patient not taking: Reported on 04/01/2017), Disp: 30 tablet, Rfl: 0 .  gabapentin (NEURONTIN) 100 MG capsule, Take 1 capsule (100 mg total) by mouth 3 (three) times daily. (Patient not taking: Reported on 04/01/2017), Disp: 90 capsule, Rfl: 0 .  solifenacin (VESICARE) 10 MG tablet, Take 1 tablet (10 mg total) by mouth daily. (Patient not taking: Reported on 05/07/2017), Disp: 90 tablet, Rfl: 0 .  Vitamin D, Ergocalciferol, (DRISDOL) 50000 units CAPS capsule, Take 1 capsule (50,000 Units total) by mouth once a week. For 12 weeks (Patient not taking: Reported on 11/06/2016), Disp: 12 capsule, Rfl: 0  No Known Allergies   Review of Systems  Constitutional: Negative for chills and  fever.  Gastrointestinal: Positive for vomiting. Negative for abdominal pain and diarrhea.  Musculoskeletal: Positive for myalgias.      Objective  Vitals:   05/07/17 1426  BP: 128/72  Pulse: 94  Resp: 14  Temp: 98.2 F (36.8 C)  TempSrc: Oral  SpO2: 98%  Weight: 193 lb 1.6 oz (87.6 kg)    Physical Exam  Constitutional: She is oriented to person, place, and time and well-developed, well-nourished, and in no distress.  HENT:  Head: Normocephalic and atraumatic.  Right Ear: External ear normal.  Left Ear: External ear normal.  Mouth/Throat: Oropharynx is clear and moist.  Cardiovascular: Normal rate, regular rhythm and normal heart sounds.  No murmur heard. Pulmonary/Chest: Effort normal and breath sounds normal. She has no wheezes.  Abdominal: Soft. Bowel sounds are  normal. There is tenderness.  Generalized mild diffuse abdominal tenderness. likely an acute GI illness, resolving, no  Neurological: She is alert and oriented to person, place, and time.  Psychiatric: Mood, memory, affect and judgment normal.  Nursing note and vitals reviewed.       Assessment & Plan  1. Non-intractable vomiting with nausea, unspecified vomiting type Likely an acute GI illness, now resolving. Advised on hydration, resumption of diet, work note for 2 days provided.   Madison Price [facility] 05/07/2017 2:35 PM

## 2017-07-01 ENCOUNTER — Ambulatory Visit: Payer: 59 | Admitting: Family Medicine

## 2017-07-15 ENCOUNTER — Other Ambulatory Visit: Payer: Self-pay | Admitting: Nurse Practitioner

## 2017-07-15 ENCOUNTER — Encounter: Payer: Self-pay | Admitting: Family Medicine

## 2017-07-15 DIAGNOSIS — I10 Essential (primary) hypertension: Secondary | ICD-10-CM

## 2017-07-15 MED ORDER — TELMISARTAN-HCTZ 40-12.5 MG PO TABS: 1.0000 | ORAL_TABLET | Freq: Every day | ORAL | 0 refills | Status: DC

## 2017-07-22 ENCOUNTER — Encounter: Payer: Managed Care, Other (non HMO) | Admitting: Family Medicine

## 2017-07-30 ENCOUNTER — Encounter: Payer: 59 | Admitting: Family Medicine

## 2018-06-30 ENCOUNTER — Telehealth: Payer: Self-pay | Admitting: *Deleted

## 2018-06-30 NOTE — Telephone Encounter (Signed)
New patient appointment on 07/09/18 with Dr End.  No answer. Left message to call back to discuss in relation to [place] and if can cancel or reschedule.

## 2018-07-03 NOTE — Telephone Encounter (Signed)
No answer. Left message for to call back to discuss switching to a virtual visit.

## 2018-07-09 ENCOUNTER — Ambulatory Visit: Payer: 59 | Admitting: Internal Medicine

## 2018-07-09 ENCOUNTER — Encounter

## 2018-07-09 NOTE — Telephone Encounter (Signed)
Patient called back and cancelled appointment and told scheduler she will call back to reschedule if she needs to.

## 2019-06-09 ENCOUNTER — Other Ambulatory Visit: Payer: Self-pay | Admitting: Student

## 2019-06-09 DIAGNOSIS — K8689 Other specified diseases of pancreas: Secondary | ICD-10-CM

## 2019-06-21 ENCOUNTER — Ambulatory Visit
Admission: RE | Admit: 2019-06-21 | Discharge: 2019-06-21 | Disposition: A | Discharge status: Home / Self Care | Payer: 59 | Source: Ambulatory Visit | Attending: Student | Admitting: Student

## 2019-06-21 ENCOUNTER — Other Ambulatory Visit: Payer: Self-pay | Admitting: Student

## 2019-06-21 ENCOUNTER — Other Ambulatory Visit: Payer: Self-pay

## 2019-06-21 DIAGNOSIS — K8689 Other specified diseases of pancreas: Secondary | ICD-10-CM | POA: Insufficient documentation

## 2019-06-21 LAB — POCT I-STAT CREATININE: Creatinine, Ser: 0.8 mg/dL (ref 0.44–1.00)

## 2019-06-21 MED ORDER — GADOBUTROL 1 MMOL/ML IV SOLN
9.0000 mL | Freq: Once | INTRAVENOUS | Status: AC | PRN
  Administered 2019-06-21: 9 mL via INTRAVENOUS

## 2019-07-16 ENCOUNTER — Other Ambulatory Visit
Admission: RE | Admit: 2019-07-16 | Discharge: 2019-07-16 | Disposition: A | Discharge status: Home / Self Care | Payer: 59 | Source: Ambulatory Visit | Attending: Internal Medicine | Admitting: Internal Medicine

## 2019-07-16 ENCOUNTER — Other Ambulatory Visit: Payer: Self-pay

## 2019-07-16 DIAGNOSIS — Z01812 Encounter for preprocedural laboratory examination: Secondary | ICD-10-CM | POA: Diagnosis present

## 2019-07-16 DIAGNOSIS — Z20822 Contact with and (suspected) exposure to covid-19: Secondary | ICD-10-CM | POA: Insufficient documentation

## 2019-07-16 LAB — SARS CORONAVIRUS 2 (TAT 6-24 HRS): SARS Coronavirus 2: NEGATIVE

## 2019-07-17 ENCOUNTER — Encounter: Payer: Self-pay | Admitting: Internal Medicine

## 2019-07-20 ENCOUNTER — Other Ambulatory Visit: Payer: Self-pay

## 2019-07-20 ENCOUNTER — Ambulatory Visit
Admission: RE | Admit: 2019-07-20 | Discharge: 2019-07-20 | Disposition: A | Discharge status: Home / Self Care | Payer: 59 | Attending: Internal Medicine | Admitting: Internal Medicine

## 2019-07-20 ENCOUNTER — Ambulatory Visit: Payer: 59 | Admitting: Certified Registered"

## 2019-07-20 ENCOUNTER — Encounter: Payer: Self-pay | Admitting: Internal Medicine

## 2019-07-20 ENCOUNTER — Encounter
Admission: RE | Disposition: A | Discharge status: Home / Self Care | Payer: Self-pay | Source: Home / Self Care | Attending: Internal Medicine

## 2019-07-20 DIAGNOSIS — F419 Anxiety disorder, unspecified: Secondary | ICD-10-CM | POA: Diagnosis not present

## 2019-07-20 DIAGNOSIS — K591 Functional diarrhea: Secondary | ICD-10-CM | POA: Insufficient documentation

## 2019-07-20 DIAGNOSIS — K640 First degree hemorrhoids: Secondary | ICD-10-CM | POA: Insufficient documentation

## 2019-07-20 DIAGNOSIS — G4730 Sleep apnea, unspecified: Secondary | ICD-10-CM | POA: Insufficient documentation

## 2019-07-20 DIAGNOSIS — Z79899 Other long term (current) drug therapy: Secondary | ICD-10-CM | POA: Insufficient documentation

## 2019-07-20 DIAGNOSIS — E785 Hyperlipidemia, unspecified: Secondary | ICD-10-CM | POA: Insufficient documentation

## 2019-07-20 DIAGNOSIS — D124 Benign neoplasm of descending colon: Secondary | ICD-10-CM | POA: Insufficient documentation

## 2019-07-20 DIAGNOSIS — F329 Major depressive disorder, single episode, unspecified: Secondary | ICD-10-CM | POA: Insufficient documentation

## 2019-07-20 DIAGNOSIS — I10 Essential (primary) hypertension: Secondary | ICD-10-CM | POA: Insufficient documentation

## 2019-07-20 DIAGNOSIS — Z86010 Personal history of colonic polyps: Secondary | ICD-10-CM | POA: Insufficient documentation

## 2019-07-20 DIAGNOSIS — D122 Benign neoplasm of ascending colon: Secondary | ICD-10-CM | POA: Insufficient documentation

## 2019-07-20 DIAGNOSIS — E119 Type 2 diabetes mellitus without complications: Secondary | ICD-10-CM | POA: Insufficient documentation

## 2019-07-20 HISTORY — PX: COLONOSCOPY WITH PROPOFOL: SHX5780

## 2019-07-20 LAB — GLUCOSE, CAPILLARY: Glucose-Capillary: 111 mg/dL — ABNORMAL HIGH (ref 70–99)

## 2019-07-20 SURGERY — COLONOSCOPY WITH PROPOFOL
Anesthesia: General

## 2019-07-20 MED ORDER — PROPOFOL 10 MG/ML IV BOLUS
INTRAVENOUS | Status: DC | PRN
  Administered 2019-07-20: 70 mg via INTRAVENOUS
  Administered 2019-07-20: 30 mg via INTRAVENOUS

## 2019-07-20 MED ORDER — MIDAZOLAM HCL 2 MG/2ML IJ SOLN
INTRAMUSCULAR | Status: AC
  Filled 2019-07-20: qty 2

## 2019-07-20 MED ORDER — PROPOFOL 500 MG/50ML IV EMUL
INTRAVENOUS | Status: DC | PRN
  Administered 2019-07-20: 120 ug/kg/min via INTRAVENOUS

## 2019-07-20 MED ORDER — GLYCOPYRROLATE 0.2 MG/ML IJ SOLN
INTRAMUSCULAR | Status: DC | PRN
  Administered 2019-07-20: .2 mg via INTRAVENOUS

## 2019-07-20 MED ORDER — SODIUM CHLORIDE 0.9 % IV SOLN: INTRAVENOUS | Status: DC

## 2019-07-20 MED ORDER — LIDOCAINE 2% (20 MG/ML) 5 ML SYRINGE
INTRAMUSCULAR | Status: DC | PRN
  Administered 2019-07-20: 25 mg via INTRAVENOUS

## 2019-07-20 MED ORDER — MIDAZOLAM HCL 5 MG/5ML IJ SOLN
INTRAMUSCULAR | Status: DC | PRN
  Administered 2019-07-20: 2 mg via INTRAVENOUS

## 2019-07-20 NOTE — H&P (Signed)
Outpatient short stay form Pre-procedure 07/20/2019 11:40 AM Madison Price K. Alice Reichert, M.D.  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 pylori.  Patient has anywhere between 2 and 10 loose watery stools per day.  No infection has been identified.  Diarrhea was briefly relieved by taking H. pylori eradication treatment but promptly recurred after antibiotics were completed.  Patient has a history of tubular adenomatous polyps of the colon last and 2014.    Current Facility-Administered Medications:  .  0.9 %  sodium chloride infusion, , Intravenous, Continuous, [place], Benay Pike, MD, Last Rate: 20 mL/hr at 07/20/19 1138, New Bag at 07/20/19 1138  Medications Prior to Admission  Medication Sig Dispense Refill Last Dose  . dicyclomine (BENTYL) 20 MG tablet Take 20 mg by mouth every 6 (six) hours.     . Dulaglutide (TRULICITY) 1.5 0000000 SOPN Inject 1.5 mg into the skin once a week.     . hydrochlorothiazide (HYDRODIURIL) 25 MG tablet Take 25 mg by mouth daily.   Past Week at Unknown time  . losartan (COZAAR) 100 MG tablet Take 100 mg by mouth daily.   Past Week at Unknown time  . meclizine (ANTIVERT) 25 MG tablet Take 25 mg by mouth 3 (three) times daily as needed for dizziness.   Past Week at Unknown time  . rosuvastatin (CRESTOR) 40 MG tablet Take 40 mg by mouth daily.   Past Week at Unknown time  . escitalopram (LEXAPRO) 10 MG tablet Take 1 tablet (10 mg total) by mouth daily. (Patient not taking: Reported on 04/01/2017) 30 tablet 0 Not Taking at Unknown time  . gabapentin (NEURONTIN) 100 MG capsule Take 1 capsule (100 mg total) by mouth 3 (three) times daily. (Patient not taking: Reported on 04/01/2017) 90 capsule 0   .  metoprolol tartrate (LOPRESSOR) 50 MG tablet Take 50 mg by mouth 2 (two) times daily.   Not Taking at Unknown time  . solifenacin (VESICARE) 10 MG tablet Take 1 tablet (10 mg total) by mouth daily. (Patient not taking: Reported on 05/07/2017) 90 tablet 0   . telmisartan-hydrochlorothiazide (MICARDIS HCT) 40-12.5 MG tablet Take 1 tablet by mouth daily. To last until appt 07/30/2017 (Patient not taking: Reported on 07/20/2019) 16 tablet 0 Not Taking at Unknown time  . Vitamin D, Ergocalciferol, (DRISDOL) 50000 units CAPS capsule Take 1 capsule (50,000 Units total) by mouth once a week. For 12 weeks (Patient not taking: Reported on 11/06/2016) 12 capsule 0      No Known Allergies   Past Medical History:  Diagnosis Date  . Anxiety   . Depression   . Diabetes mellitus without complication ([place])   . Hyperlipidemia   . Hypertension     Review of systems:  Otherwise negative.    Physical Exam  Gen: Alert, oriented. Appears stated age.  HEENT: [place]/AT. PERRLA. Lungs: CTA, no wheezes. CV: RR nl S1, S2. Abd: soft, benign, no masses. BS+ Ext: No edema. Pulses 2+    Planned procedures: Proceed with colonoscopy with random colon biopsy and possible polypectomy. The patient understands the nature of the planned procedure, indications, risks, alternatives and potential complications including but not limited to bleeding, infection, perforation, damage to internal organs and possible oversedation/side effects from anesthesia. The patient agrees and gives consent  to proceed.  Please refer to procedure notes for findings, recommendations and patient disposition/instructions.     Satcha Storlie K. Alice Reichert, M.D. Gastroenterology 07/20/2019  11:40 AM

## 2019-07-20 NOTE — Anesthesia Preprocedure Evaluation (Signed)
Anesthesia Evaluation  Patient identified by MRN, date of birth, ID band Patient awake    Reviewed: Allergy & Precautions, H&P , NPO status , Patient's Chart, lab work & pertinent test results  Airway Mallampati: III  TM Distance: >3 FB Neck ROM: full    Dental  (+) Teeth Intact   Pulmonary sleep apnea and Continuous Positive Airway Pressure Ventilation , neg COPD,    breath sounds clear to auscultation       Cardiovascular hypertension, (-) angina(-) Past MI (-) dysrhythmias  Rhythm:regular Rate:Normal     Neuro/Psych PSYCHIATRIC DISORDERS Anxiety Depression negative neurological ROS     GI/Hepatic negative GI ROS, Neg liver ROS,   Endo/Other  diabetes  Renal/GU negative Renal ROS  negative genitourinary   Musculoskeletal   Abdominal   Peds  Hematology negative hematology ROS (+)   Anesthesia Other Findings Past Medical History: No date: Anxiety No date: Depression No date: Diabetes mellitus without complication ([facility]) No date: Hyperlipidemia No date: Hypertension  Past Surgical History: No date: CESAREAN SECTION 2007: CHOLECYSTECTOMY No date: gall bladder removed No date: WISDOM TOOTH EXTRACTION  BMI    Body Mass Index: 33.30 kg/m      Reproductive/Obstetrics negative OB ROS                             Anesthesia Physical Anesthesia Plan  ASA: II  Anesthesia Plan: General   Post-op Pain Management:    Induction:   PONV Risk Score and Plan: Propofol infusion and TIVA  Airway Management Planned: Natural Airway and Nasal Cannula  Additional Equipment:   Intra-op Plan:   Post-operative Plan:   Informed Consent: I have reviewed the patients History and Physical, chart, labs and discussed the procedure including the risks, benefits and alternatives for the proposed anesthesia with the patient or authorized representative who has indicated his/her understanding and  acceptance.     Dental Advisory Given  Plan Discussed with: Anesthesiologist  Anesthesia Plan Comments:         Anesthesia Quick Evaluation

## 2019-07-20 NOTE — Interval H&P Note (Signed)
History and Physical Interval Note:  07/20/2019 11:42 AM  Madison Price  has presented today for surgery, with the diagnosis of PH POLYPS.  The various methods of treatment have been discussed with the patient and family. After consideration of risks, benefits and other options for treatment, the patient has consented to  Procedure(s): COLONOSCOPY WITH PROPOFOL (N/A) as a surgical intervention.  The patient's history has been reviewed, patient examined, no change in status, stable for surgery.  I have reviewed the patient's chart and labs.  Questions were answered to the patient's satisfaction.     [place], [place]

## 2019-07-20 NOTE — Anesthesia Postprocedure Evaluation (Signed)
Anesthesia Post Note  Patient: Madison Price  Procedure(s) Performed: COLONOSCOPY WITH PROPOFOL (N/A )  Patient location during evaluation: PACU Anesthesia Type: General Level of consciousness: awake and alert Pain management: pain level controlled Vital Signs Assessment: post-procedure vital signs reviewed and stable Respiratory status: spontaneous breathing, nonlabored ventilation and respiratory function stable Cardiovascular status: blood pressure returned to baseline and stable Postop Assessment: no apparent nausea or vomiting Anesthetic complications: no     Last Vitals:  Vitals:   07/20/19 1204 07/20/19 1234  BP: 114/84 127/89  Pulse: 98   Resp: 15   Temp: (!) 36 C   SpO2: 98%     Last Pain:  Vitals:   07/20/19 1234  TempSrc:   PainSc: 0-No pain                 Brett Canales Vartan Kerins

## 2019-07-20 NOTE — Op Note (Signed)
[facility] Patient Name: Madison Price Procedure Date: 07/20/2019 11:30 AM MRN: [id number] Account #: [id number] Date of Birth: [birth year] Admit Type: Outpatient Age: 59 Room: [facility] ENDO ROOM 3 Gender: Female Note Status: Finalized Procedure:             Colonoscopy Indications:           Functional diarrhea Providers:             Benay Pike. Alice Reichert MD, MD Referring MD:          Arby Barrette, MD (Referring MD) Medicines:             Propofol per Anesthesia Complications:         No immediate complications. Procedure:             Pre-Anesthesia Assessment:                        - The risks and benefits of the procedure and the                         sedation options and risks were discussed with the                         patient. All questions were answered and informed                         consent was obtained.                        - Patient identification and proposed procedure were                         verified prior to the procedure by the nurse. The                         procedure was verified in the procedure room.                        - ASA Grade Assessment: III - A patient with severe                         systemic disease.                        - After reviewing the risks and benefits, the patient                         was deemed in satisfactory condition to undergo the                         procedure.                        After obtaining informed consent, the colonoscope was                         passed under direct vision. Throughout the procedure,                         the patient's blood pressure, pulse, and oxygen  saturations were monitored continuously. The                         Colonoscope was introduced through the anus and                         advanced to the the cecum, identified by appendiceal                         orifice and ileocecal valve. The colonoscopy  was                         performed without difficulty. The patient tolerated                         the procedure well. The quality of the bowel                         preparation was adequate. The ileocecal valve,                         appendiceal orifice, and rectum were photographed. Findings:      The perianal and digital rectal examinations were normal. Pertinent       negatives include normal sphincter tone and no palpable rectal lesions.      Non-bleeding internal hemorrhoids were found during retroflexion. The       hemorrhoids were Grade I (internal hemorrhoids that do not prolapse).      A 5 mm polyp was found in the descending colon. The polyp was sessile.       The polyp was removed with a cold biopsy forceps. Resection and       retrieval were complete.      A 6 mm polyp was found in the ascending colon. The polyp was sessile.       The polyp was removed with a cold snare. Resection and retrieval were       complete.      Normal mucosa was found in the entire colon. Biopsies for histology were       taken with a cold forceps from the random colon for evaluation of       microscopic colitis.      The exam was otherwise without abnormality on direct and retroflexion       views. Impression:            - Non-bleeding internal hemorrhoids.                        - One 5 mm polyp in the descending colon, removed with                         a cold biopsy forceps. Resected and retrieved.                        - One 6 mm polyp in the ascending colon, removed with                         a cold snare. Resected and retrieved.                        -  Normal mucosa in the entire examined colon. Biopsied.                        - The examination was otherwise normal on direct and                         retroflexion views. Recommendation:        - Patient has a contact number available for                         emergencies. The signs and symptoms of potential                          delayed complications were discussed with the patient.                         Return to normal activities tomorrow. Written                         discharge instructions were provided to the patient.                        - Resume previous diet.                        - Continue present medications.                        - Await pathology results.                        - Repeat colonoscopy for surveillance based on                         pathology results.                        - Return to physician assistant in 6 weeks.                        - Please follow up with Effie Berkshire, PA-C in 6                         weeks Procedure Code(s):     --- Professional ---                        [phone], Colonoscopy, flexible; with removal of                         tumor(s), polyp(s), or other lesion(s) by snare                         technique                        [id number], 63, Colonoscopy, flexible; with biopsy, single                         or multiple Diagnosis Code(s):     --- Professional ---  K59.1, Functional diarrhea                        K64.0, First degree hemorrhoids                        K63.5, Polyp of colon CPT copyright 2019 American Medical Association. All rights reserved. The codes documented in this report are preliminary and upon coder review may  be revised to meet current compliance requirements. Efrain Sella MD, MD 07/20/2019 12:03:05 PM This report has been signed electronically. Number of Addenda: 0 Note Initiated On: 07/20/2019 11:30 AM Scope Withdrawal Time: 0 hours 7 minutes 6 seconds  Total Procedure Duration: 0 hours 12 minutes 40 seconds  Estimated Blood Loss:  Estimated blood loss: none.      [facility]

## 2019-07-20 NOTE — Transfer of Care (Signed)
Immediate Anesthesia Transfer of Care Note  Patient: Madison Price  Procedure(s) Performed: COLONOSCOPY WITH PROPOFOL (N/A )  Patient Location: Endoscopy Unit  Anesthesia Type:General  Level of Consciousness: sedated  Airway & Oxygen Therapy: Patient Spontanous Breathing and Patient connected to nasal cannula oxygen  Post-op Assessment: Report given to RN and Post -op Vital signs reviewed and stable  Post vital signs: Reviewed  Last Vitals:  Vitals Value Taken Time  BP 114/84 07/20/19 1204  Temp    Pulse 98 07/20/19 1204  Resp 15 07/20/19 1204  SpO2 98 % 07/20/19 1204    Last Pain:  Vitals:   07/20/19 1204  TempSrc: (P) Temporal  PainSc:          Complications: No apparent anesthesia complications

## 2019-07-21 ENCOUNTER — Encounter: Payer: Self-pay | Admitting: *Deleted

## 2019-07-21 LAB — SURGICAL PATHOLOGY

## 2019-08-25 ENCOUNTER — Other Ambulatory Visit: Payer: Self-pay | Admitting: Family

## 2019-08-25 DIAGNOSIS — Z1231 Encounter for screening mammogram for malignant neoplasm of breast: Secondary | ICD-10-CM

## 2019-09-09 ENCOUNTER — Ambulatory Visit
Admission: RE | Admit: 2019-09-09 | Discharge: 2019-09-09 | Disposition: A | Discharge status: Home / Self Care | Payer: 59 | Source: Ambulatory Visit | Attending: Family | Admitting: Family

## 2019-09-09 DIAGNOSIS — Z1231 Encounter for screening mammogram for malignant neoplasm of breast: Secondary | ICD-10-CM | POA: Insufficient documentation

## 2020-01-11 ENCOUNTER — Ambulatory Visit: Payer: 59 | Attending: Internal Medicine

## 2020-01-11 DIAGNOSIS — Z23 Encounter for immunization: Secondary | ICD-10-CM

## 2020-01-11 NOTE — Progress Notes (Signed)
   Covid-19 Vaccination Clinic  Name:  Madison Price    MRN: [id number] DOB: October 25, 1960  01/11/2020  Madison Price was observed post Covid-19 immunization for 15 minutes without incident. She was provided with Vaccine Information Sheet and instruction to access the V-Safe system.   Madison Price was instructed to call 911 with any severe reactions post vaccine: Marland Kitchen Difficulty breathing  . Swelling of face and throat  . A fast heartbeat  . A bad rash all over body  . Dizziness and weakness

## 2020-10-20 SURGERY — ESOPHAGOGASTRODUODENOSCOPY (EGD) WITH PROPOFOL
Anesthesia: General

## 2021-06-15 DIAGNOSIS — D519 Vitamin B12 deficiency anemia, unspecified: Secondary | ICD-10-CM | POA: Diagnosis not present

## 2021-06-15 DIAGNOSIS — E785 Hyperlipidemia, unspecified: Secondary | ICD-10-CM | POA: Diagnosis not present

## 2021-06-15 DIAGNOSIS — E559 Vitamin D deficiency, unspecified: Secondary | ICD-10-CM | POA: Diagnosis not present

## 2021-06-15 DIAGNOSIS — E119 Type 2 diabetes mellitus without complications: Secondary | ICD-10-CM | POA: Diagnosis not present

## 2021-06-15 DIAGNOSIS — I10 Essential (primary) hypertension: Secondary | ICD-10-CM | POA: Diagnosis not present

## 2021-06-15 DIAGNOSIS — E782 Mixed hyperlipidemia: Secondary | ICD-10-CM | POA: Diagnosis not present

## 2021-06-15 DIAGNOSIS — R079 Chest pain, unspecified: Secondary | ICD-10-CM | POA: Diagnosis not present

## 2021-10-16 DIAGNOSIS — E785 Hyperlipidemia, unspecified: Secondary | ICD-10-CM | POA: Diagnosis not present

## 2021-10-16 DIAGNOSIS — E559 Vitamin D deficiency, unspecified: Secondary | ICD-10-CM | POA: Diagnosis not present

## 2021-10-16 DIAGNOSIS — R300 Dysuria: Secondary | ICD-10-CM | POA: Diagnosis not present

## 2021-10-16 DIAGNOSIS — E782 Mixed hyperlipidemia: Secondary | ICD-10-CM | POA: Diagnosis not present

## 2021-10-16 DIAGNOSIS — E1165 Type 2 diabetes mellitus with hyperglycemia: Secondary | ICD-10-CM | POA: Diagnosis not present

## 2021-10-16 DIAGNOSIS — D519 Vitamin B12 deficiency anemia, unspecified: Secondary | ICD-10-CM | POA: Diagnosis not present

## 2021-10-16 DIAGNOSIS — I10 Essential (primary) hypertension: Secondary | ICD-10-CM | POA: Diagnosis not present

## 2021-10-16 DIAGNOSIS — G4733 Obstructive sleep apnea (adult) (pediatric): Secondary | ICD-10-CM | POA: Diagnosis not present

## 2021-11-03 DIAGNOSIS — D519 Vitamin B12 deficiency anemia, unspecified: Secondary | ICD-10-CM | POA: Diagnosis not present

## 2021-12-15 DIAGNOSIS — D519 Vitamin B12 deficiency anemia, unspecified: Secondary | ICD-10-CM | POA: Diagnosis not present

## 2021-12-29 DIAGNOSIS — D519 Vitamin B12 deficiency anemia, unspecified: Secondary | ICD-10-CM | POA: Diagnosis not present

## 2022-02-19 DIAGNOSIS — I10 Essential (primary) hypertension: Secondary | ICD-10-CM | POA: Diagnosis not present

## 2022-02-19 DIAGNOSIS — E785 Hyperlipidemia, unspecified: Secondary | ICD-10-CM | POA: Diagnosis not present

## 2022-02-19 DIAGNOSIS — K219 Gastro-esophageal reflux disease without esophagitis: Secondary | ICD-10-CM | POA: Diagnosis not present

## 2022-02-19 DIAGNOSIS — E1165 Type 2 diabetes mellitus with hyperglycemia: Secondary | ICD-10-CM | POA: Diagnosis not present

## 2022-02-19 DIAGNOSIS — Z23 Encounter for immunization: Secondary | ICD-10-CM | POA: Diagnosis not present

## 2022-02-19 DIAGNOSIS — D519 Vitamin B12 deficiency anemia, unspecified: Secondary | ICD-10-CM | POA: Diagnosis not present

## 2022-02-19 DIAGNOSIS — E782 Mixed hyperlipidemia: Secondary | ICD-10-CM | POA: Diagnosis not present

## 2022-02-19 DIAGNOSIS — G4733 Obstructive sleep apnea (adult) (pediatric): Secondary | ICD-10-CM | POA: Diagnosis not present

## 2022-04-20 DIAGNOSIS — D519 Vitamin B12 deficiency anemia, unspecified: Secondary | ICD-10-CM | POA: Diagnosis not present

## 2022-05-21 ENCOUNTER — Ambulatory Visit (INDEPENDENT_AMBULATORY_CARE_PROVIDER_SITE_OTHER): Payer: 59 | Admitting: Family

## 2022-05-21 DIAGNOSIS — E538 Deficiency of other specified B group vitamins: Secondary | ICD-10-CM | POA: Diagnosis not present

## 2022-05-21 MED ORDER — CYANOCOBALAMIN 1000 MCG/ML IJ SOLN
1000.0000 ug | Freq: Once | INTRAMUSCULAR | Status: AC
  Administered 2022-07-20: 1000 ug via INTRAMUSCULAR

## 2022-05-24 ENCOUNTER — Other Ambulatory Visit: Payer: Self-pay | Admitting: Family

## 2022-05-24 NOTE — Progress Notes (Signed)
    CHIEF COMPLAINT  B12 Shot     REASON FOR VISIT  B12 Injection     ASSESSMENT  B12 Deficiency, Unspecified     PLAN  Pt. given B12 injection in clinic.   Return for next injection per provider instructions.

## 2022-05-30 ENCOUNTER — Encounter: Payer: Self-pay | Admitting: Family

## 2022-06-02 ENCOUNTER — Other Ambulatory Visit: Payer: Self-pay | Admitting: Family

## 2022-06-19 ENCOUNTER — Ambulatory Visit: Payer: 59

## 2022-06-20 ENCOUNTER — Other Ambulatory Visit: Payer: Self-pay | Admitting: Family

## 2022-06-21 ENCOUNTER — Ambulatory Visit: Payer: 59 | Admitting: Family

## 2022-06-21 ENCOUNTER — Encounter: Payer: Self-pay | Admitting: Family

## 2022-06-21 VITALS — BP 118/64 | HR 90 | Ht 64.0 in | Wt 170.0 lb

## 2022-06-21 DIAGNOSIS — E538 Deficiency of other specified B group vitamins: Secondary | ICD-10-CM | POA: Diagnosis not present

## 2022-06-21 DIAGNOSIS — B078 Other viral warts: Secondary | ICD-10-CM | POA: Insufficient documentation

## 2022-06-21 DIAGNOSIS — I10 Essential (primary) hypertension: Secondary | ICD-10-CM

## 2022-06-21 DIAGNOSIS — O159 Eclampsia, unspecified as to time period: Secondary | ICD-10-CM | POA: Insufficient documentation

## 2022-06-21 DIAGNOSIS — M5430 Sciatica, unspecified side: Secondary | ICD-10-CM | POA: Insufficient documentation

## 2022-06-21 DIAGNOSIS — E668 Other obesity: Secondary | ICD-10-CM | POA: Insufficient documentation

## 2022-06-21 DIAGNOSIS — Z860100 Personal history of colon polyps, unspecified: Secondary | ICD-10-CM

## 2022-06-21 DIAGNOSIS — E1165 Type 2 diabetes mellitus with hyperglycemia: Secondary | ICD-10-CM

## 2022-06-21 DIAGNOSIS — E7800 Pure hypercholesterolemia, unspecified: Secondary | ICD-10-CM

## 2022-06-21 DIAGNOSIS — H1013 Acute atopic conjunctivitis, bilateral: Secondary | ICD-10-CM | POA: Diagnosis not present

## 2022-06-21 DIAGNOSIS — Z1231 Encounter for screening mammogram for malignant neoplasm of breast: Secondary | ICD-10-CM | POA: Diagnosis not present

## 2022-06-21 DIAGNOSIS — H109 Unspecified conjunctivitis: Secondary | ICD-10-CM | POA: Insufficient documentation

## 2022-06-21 DIAGNOSIS — E559 Vitamin D deficiency, unspecified: Secondary | ICD-10-CM

## 2022-06-21 DIAGNOSIS — R5383 Other fatigue: Secondary | ICD-10-CM | POA: Diagnosis not present

## 2022-06-21 DIAGNOSIS — R058 Other specified cough: Secondary | ICD-10-CM | POA: Insufficient documentation

## 2022-06-21 DIAGNOSIS — M5410 Radiculopathy, site unspecified: Secondary | ICD-10-CM

## 2022-06-21 DIAGNOSIS — M25519 Pain in unspecified shoulder: Secondary | ICD-10-CM

## 2022-06-21 DIAGNOSIS — W57XXXA Bitten or stung by nonvenomous insect and other nonvenomous arthropods, initial encounter: Secondary | ICD-10-CM | POA: Insufficient documentation

## 2022-06-21 HISTORY — DX: Unspecified conjunctivitis: H10.9

## 2022-06-21 HISTORY — DX: Pain in unspecified shoulder: M25.519

## 2022-06-21 HISTORY — DX: Sciatica, unspecified side: M54.30

## 2022-06-21 HISTORY — DX: Eclampsia, unspecified as to time period: O15.9

## 2022-06-21 HISTORY — DX: Personal history of colon polyps, unspecified: Z86.0100

## 2022-06-21 HISTORY — DX: Radiculopathy, site unspecified: M54.10

## 2022-06-21 HISTORY — DX: Bitten or stung by nonvenomous insect and other nonvenomous arthropods, initial encounter: W57.XXXA

## 2022-06-21 HISTORY — DX: Other specified cough: R05.8

## 2022-06-21 MED ORDER — CYANOCOBALAMIN 1000 MCG/ML IJ SOLN
1000.0000 ug | Freq: Once | INTRAMUSCULAR | Status: AC
  Administered 2022-06-21: 1000 ug via INTRAMUSCULAR

## 2022-06-21 MED ORDER — ZENPEP 40000-126000 UNITS PO CPEP: 1.0000 | ORAL_CAPSULE | Freq: Three times a day (TID) | ORAL | 1 refills | Status: AC

## 2022-06-21 NOTE — Progress Notes (Signed)
Established Patient Office Visit  Subjective:  Patient ID: Madison Price, female    DOB: 1961/02/10  Age: 62 y.o. MRN: [id number]  Chief Complaint  Patient presents with   Follow-up    4 month follow up    Patient is here today for her 3 months follow up.  She has been feeling well since last appointment.   She does have additional concerns to discuss today.  Eyes are red, itchy, and painful to the touch. This has been going on for about a month or so  She does have silicon plugs in her tear ducts. There is a yellow crust that she gets off her eyes and it sometimes has white spots.   Labs are due today. She needs refills.   I have reviewed her active problem list, medication list, allergies, notes from last encounter, lab results for her appointment today.   No other concerns at this time.   Past Medical History:  Diagnosis Date   Anxiety    Cervical radiculitis 07/27/2014   Conjunctivitis 06/21/2022   Cough with expectoration 06/21/2022   Depression    Diabetes mellitus without complication ([place])    Eclampsia (toxemia of pregnancy) 06/21/2022   History of colon polyps 06/21/2022   Hyperlipidemia    Hypertension    Nerve root pain 06/21/2022   Neuralgia neuritis, sciatic nerve 06/21/2022   Pain in shoulder 06/21/2022   Tick bite 06/21/2022    Past Surgical History:  Procedure Laterality Date   CESAREAN SECTION     CHOLECYSTECTOMY  2007   COLONOSCOPY WITH PROPOFOL N/A 07/20/2019   Procedure: COLONOSCOPY WITH PROPOFOL;  Surgeon: Toledo, Benay Pike, MD;  Location: [facility] ENDOSCOPY;  Service: Gastroenterology;  Laterality: N/A;   gall bladder removed     WISDOM TOOTH EXTRACTION      Social History   Socioeconomic History   Marital status: Single    Spouse name: Not on file   Number of children: Not on file   Years of education: Not on file   Highest education level: Not on file  Occupational History   Not on file  Tobacco Use   Smoking status:  Never   Smokeless tobacco: Never  Vaping Use   Vaping Use: Never used  Substance and Sexual Activity   Alcohol use: No    Alcohol/week: 0.0 standard drinks of alcohol   Drug use: No   Sexual activity: Never  Other Topics Concern   Not on file  Social History Narrative   Not on file   Social Determinants of Health   Financial Resource Strain: Not on file  Food Insecurity: Not on file  Transportation Needs: Not on file  Physical Activity: Not on file  Stress: Not on file  Social Connections: Not on file  Intimate Partner Violence: Not on file    Family History  Problem Relation Age of Onset   Schizophrenia Mother        paranoid   Heart disease Father    Cancer Sister        breast   Breast cancer Sister 55   Cancer Maternal Aunt        colon    No Known Allergies  Review of Systems  All other systems reviewed and are negative.      Objective:   BP 118/64   Pulse 90   Ht 5\' 4"  (1.626 m)   Wt 170 lb (77.1 kg)   LMP  (LMP Unknown)   SpO2 96%  BMI 29.18 kg/m   Vitals:   06/21/22 0859  BP: 118/64  Pulse: 90  Height: 5\' 4"  (1.626 m)  Weight: 170 lb (77.1 kg)  SpO2: 96%  BMI (Calculated): 29.17    Physical Exam Vitals and nursing note reviewed.  Constitutional:      Appearance: Normal appearance. She is normal weight.  HENT:     Head: Normocephalic.  Eyes:     Pupils: Pupils are equal, round, and reactive to light.  Cardiovascular:     Rate and Rhythm: Normal rate.  Pulmonary:     Effort: Pulmonary effort is normal.  Skin:    Capillary Refill: Capillary refill takes less than 2 seconds.  Neurological:     General: No focal deficit present.     Mental Status: She is alert and oriented to person, place, and time.      No results found for any visits on 06/21/22.  No results found for this or any previous visit (from the past 2160 hour(s)).    Assessment & Plan:   Problem List Items Addressed This Visit     Hyperlipidemia    Relevant Orders   Lipid panel   CBC With Differential   CMP14+EGFR   Other Visit Diagnoses     Vitamin B12 deficiency    -  Primary   Relevant Medications   cyanocobalamin (VITAMIN B12) injection 1,000 mcg   Other Relevant Orders   CBC With Differential   CMP14+EGFR   Vitamin B12   Vitamin D deficiency, unspecified       Relevant Orders   VITAMIN D 25 Hydroxy (Vit-D Deficiency, Fractures)   CBC With Differential   CMP14+EGFR   Type 2 diabetes mellitus with hyperglycemia, without long-term current use of insulin (HCC)       Relevant Medications   JARDIANCE 25 MG TABS tablet   Other Relevant Orders   CBC With Differential   CMP14+EGFR   Hemoglobin A1c   Essential hypertension, benign       Relevant Orders   CBC With Differential   CMP14+EGFR   Other fatigue       Relevant Orders   CBC With Differential   CMP14+EGFR   TSH   Screening mammogram for breast cancer       Relevant Orders   MM 3D SCREENING MAMMOGRAM BILATERAL BREAST   Allergic conjunctivitis of both eyes           Return in about 3 months (around 09/21/2022) for F/U.   Total time spent: 30 minutes  Mechele Claude, FNP  06/21/2022

## 2022-06-22 LAB — CBC WITH DIFFERENTIAL
Basophils Absolute: 0 10*3/uL (ref 0.0–0.2)
Basos: 0 %
EOS (ABSOLUTE): 0.1 10*3/uL (ref 0.0–0.4)
Eos: 1 %
Hematocrit: 43.9 % (ref 34.0–46.6)
Hemoglobin: 14.7 g/dL (ref 11.1–15.9)
Immature Grans (Abs): 0 10*3/uL (ref 0.0–0.1)
Immature Granulocytes: 1 %
Lymphocytes Absolute: 2.1 10*3/uL (ref 0.7–3.1)
Lymphs: 31 %
MCH: 29.8 pg (ref 26.6–33.0)
MCHC: 33.5 g/dL (ref 31.5–35.7)
MCV: 89 fL (ref 79–97)
Monocytes Absolute: 0.6 10*3/uL (ref 0.1–0.9)
Monocytes: 9 %
Neutrophils Absolute: 3.9 10*3/uL (ref 1.4–7.0)
Neutrophils: 58 %
RBC: 4.94 x10E6/uL (ref 3.77–5.28)
RDW: 13.2 % (ref 11.7–15.4)
WBC: 6.8 10*3/uL (ref 3.4–10.8)

## 2022-06-22 LAB — TSH: TSH: 1.27 u[IU]/mL (ref 0.450–4.500)

## 2022-06-22 LAB — CMP14+EGFR
ALT: 26 IU/L (ref 0–32)
AST: 19 IU/L (ref 0–40)
Albumin/Globulin Ratio: 1.8 (ref 1.2–2.2)
Albumin: 4.6 g/dL (ref 3.8–4.9)
Alkaline Phosphatase: 88 IU/L (ref 44–121)
BUN/Creatinine Ratio: 19 (ref 12–28)
BUN: 14 mg/dL (ref 8–27)
Bilirubin Total: 0.5 mg/dL (ref 0.0–1.2)
CO2: 25 mmol/L (ref 20–29)
Calcium: 10 mg/dL (ref 8.7–10.3)
Chloride: 104 mmol/L (ref 96–106)
Creatinine, Ser: 0.75 mg/dL (ref 0.57–1.00)
Globulin, Total: 2.6 g/dL (ref 1.5–4.5)
Glucose: 102 mg/dL — ABNORMAL HIGH (ref 70–99)
Potassium: 4.5 mmol/L (ref 3.5–5.2)
Sodium: 143 mmol/L (ref 134–144)
Total Protein: 7.2 g/dL (ref 6.0–8.5)
eGFR: 91 mL/min/{1.73_m2} (ref >59)

## 2022-06-22 LAB — LIPID PANEL
Chol/HDL Ratio: 2.8 ratio (ref 0.0–4.4)
Cholesterol, Total: 88 mg/dL — ABNORMAL LOW (ref 100–199)
HDL: 32 mg/dL — ABNORMAL LOW (ref >39)
LDL Chol Calc (NIH): 35 mg/dL (ref 0–99)
Triglycerides: 113 mg/dL (ref 0–149)
VLDL Cholesterol Cal: 21 mg/dL (ref 5–40)

## 2022-06-22 LAB — VITAMIN B12: Vitamin B-12: 429 pg/mL (ref 232–1245)

## 2022-06-22 LAB — HEMOGLOBIN A1C
Est. average glucose Bld gHb Est-mCnc: 114 mg/dL
Hgb A1c MFr Bld: 5.6 % (ref 4.8–5.6)

## 2022-06-22 LAB — VITAMIN D 25 HYDROXY (VIT D DEFICIENCY, FRACTURES): Vit D, 25-Hydroxy: 42.3 ng/mL (ref 30.0–100.0)

## 2022-06-27 ENCOUNTER — Encounter: Payer: Self-pay | Admitting: Family

## 2022-07-19 ENCOUNTER — Other Ambulatory Visit: Payer: Self-pay | Admitting: Family

## 2022-07-20 ENCOUNTER — Ambulatory Visit (INDEPENDENT_AMBULATORY_CARE_PROVIDER_SITE_OTHER): Payer: 59 | Admitting: Family

## 2022-07-20 ENCOUNTER — Other Ambulatory Visit: Payer: Self-pay | Admitting: Family

## 2022-07-20 DIAGNOSIS — E538 Deficiency of other specified B group vitamins: Secondary | ICD-10-CM

## 2022-07-20 MED ORDER — CYANOCOBALAMIN 1000 MCG/ML IJ SOLN
1000.0000 ug | Freq: Once | INTRAMUSCULAR | Status: AC
Start: 2022-07-20 — End: 2022-07-20
  Administered 2022-07-20: 1000 ug via INTRAMUSCULAR

## 2022-07-22 NOTE — Progress Notes (Signed)
   CHIEF COMPLAINT  B12 Shot     REASON FOR VISIT  B12 Injection     ASSESSMENT  B12 Deficiency, Unspecified     PLAN  Diagnoses and all orders for this visit:  Vitamin B12 deficiency -     cyanocobalamin (VITAMIN B12) injection 1,000 mcg     Pt. given B12 injection in clinic.  Return for next injection per provider instructions.   Total time spent: 10 minutes  Miki Kins, FNP  07/20/2022

## 2022-07-23 ENCOUNTER — Ambulatory Visit: Payer: 59

## 2022-07-24 ENCOUNTER — Encounter: Payer: Self-pay | Admitting: Family

## 2022-07-30 ENCOUNTER — Encounter: Payer: Self-pay | Admitting: Family

## 2022-08-22 ENCOUNTER — Ambulatory Visit (INDEPENDENT_AMBULATORY_CARE_PROVIDER_SITE_OTHER): Payer: 59 | Admitting: Family

## 2022-08-22 ENCOUNTER — Telehealth: Payer: Self-pay

## 2022-08-22 DIAGNOSIS — E538 Deficiency of other specified B group vitamins: Secondary | ICD-10-CM | POA: Diagnosis not present

## 2022-08-22 MED ORDER — CYANOCOBALAMIN 1000 MCG/ML IJ SOLN
1000.0000 ug | Freq: Once | INTRAMUSCULAR | Status: AC
Start: 2022-08-22 — End: 2022-08-22
  Administered 2022-08-22: 1000 ug via INTRAMUSCULAR

## 2022-08-22 NOTE — Progress Notes (Signed)
   CHIEF COMPLAINT  B12 Shot     REASON FOR VISIT  B12 Injection     ASSESSMENT  B12 Deficiency, Unspecified     PLAN  Diagnoses and all orders for this visit:  Vitamin B12 deficiency -     cyanocobalamin (VITAMIN B12) injection 1,000 mcg     Pt. given B12 injection in clinic.  Return for next injection per provider instructions.   Total time spent: 10 minutes  Miki Kins, FNP  08/22/2022

## 2022-08-22 NOTE — Telephone Encounter (Signed)
Pt came by for b12 injection today, mentioned her trulicity rx is over $400 & jardiance is $143- using copay cards as well. She asked about alternative rx since the copay is so high for both rx's, I did give her some samples of jardiance in the meantime. Please advise

## 2022-08-24 ENCOUNTER — Other Ambulatory Visit: Payer: Self-pay | Admitting: Family

## 2022-09-07 ENCOUNTER — Other Ambulatory Visit: Payer: Self-pay | Admitting: Family

## 2022-09-19 ENCOUNTER — Other Ambulatory Visit: Payer: Self-pay | Admitting: Family

## 2022-09-21 ENCOUNTER — Ambulatory Visit: Payer: 59 | Admitting: Family

## 2022-09-21 ENCOUNTER — Encounter: Payer: Self-pay | Admitting: Family

## 2022-09-21 VITALS — BP 140/90 | HR 63 | Ht 64.0 in | Wt 176.8 lb

## 2022-09-21 DIAGNOSIS — R2230 Localized swelling, mass and lump, unspecified upper limb: Secondary | ICD-10-CM

## 2022-09-21 DIAGNOSIS — E559 Vitamin D deficiency, unspecified: Secondary | ICD-10-CM | POA: Diagnosis not present

## 2022-09-21 DIAGNOSIS — R1031 Right lower quadrant pain: Secondary | ICD-10-CM

## 2022-09-21 DIAGNOSIS — E1165 Type 2 diabetes mellitus with hyperglycemia: Secondary | ICD-10-CM | POA: Diagnosis not present

## 2022-09-21 DIAGNOSIS — R7401 Elevation of levels of liver transaminase levels: Secondary | ICD-10-CM

## 2022-09-21 DIAGNOSIS — E7800 Pure hypercholesterolemia, unspecified: Secondary | ICD-10-CM | POA: Diagnosis not present

## 2022-09-21 DIAGNOSIS — Z6830 Body mass index (BMI) 30.0-30.9, adult: Secondary | ICD-10-CM

## 2022-09-21 DIAGNOSIS — R000 Tachycardia, unspecified: Secondary | ICD-10-CM

## 2022-09-21 DIAGNOSIS — E538 Deficiency of other specified B group vitamins: Secondary | ICD-10-CM | POA: Diagnosis not present

## 2022-09-21 DIAGNOSIS — I10 Essential (primary) hypertension: Secondary | ICD-10-CM

## 2022-09-21 DIAGNOSIS — R5383 Other fatigue: Secondary | ICD-10-CM

## 2022-09-21 DIAGNOSIS — S40029A Contusion of unspecified upper arm, initial encounter: Secondary | ICD-10-CM

## 2022-09-21 DIAGNOSIS — R635 Abnormal weight gain: Secondary | ICD-10-CM

## 2022-09-21 HISTORY — DX: Right lower quadrant pain: R10.31

## 2022-09-21 HISTORY — DX: Contusion of unspecified upper arm, initial encounter: S40.029A

## 2022-09-21 HISTORY — DX: Elevation of levels of liver transaminase levels: R74.01

## 2022-09-21 HISTORY — DX: Localized swelling, mass and lump, unspecified upper limb: R22.30

## 2022-09-21 HISTORY — DX: Tachycardia, unspecified: R00.0

## 2022-09-21 HISTORY — DX: Abnormal weight gain: R63.5

## 2022-09-21 MED ORDER — METFORMIN HCL 500 MG PO TABS: 500.0000 mg | ORAL_TABLET | Freq: Two times a day (BID) | ORAL | 3 refills | Status: DC

## 2022-09-21 NOTE — Assessment & Plan Note (Signed)
Checking labs today.  Continue current therapy for lipid control. Will modify as needed based on labwork results.  

## 2022-09-21 NOTE — Assessment & Plan Note (Signed)
Checking labs today. Will call pt. With results  Patient given samples of Farxiga.  Sending RX of Metformin for her.  Will adjust meds if needed based on labs.

## 2022-09-21 NOTE — Assessment & Plan Note (Signed)
Blood pressure well controlled with current medications.  Continue current therapy.  Will reassess at follow up.  

## 2022-09-21 NOTE — Progress Notes (Signed)
Established Patient Office Visit  Subjective:  Patient ID: Madison Price, female    DOB: 09-29-1960  Age: 62 y.o. MRN: [id number]  Chief Complaint  Patient presents with   Follow-up    3 mo F/U    Patient is here today for her 3 months follow up.  She has been feeling fairly well since last appointment.   She does have additional concerns to discuss today.  She has not been able to get her medications, as she was suddenly told that she would have to pay over 100.00 per month for it.  Asks if we can start her on something else.   Labs are due today. She needs refills.   I have reviewed her active problem list, medication list, allergies, notes from last encounter, lab results for her appointment today.    No other concerns at this time.   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   Anxiety    Arm bruise 09/21/2022   Axillary lump 09/21/2022   Cervical radiculitis 07/27/2014   Conjunctivitis 06/21/2022   Cough with expectoration 06/21/2022   Depression    Diabetes mellitus without complication (HCC)    Eclampsia (toxemia of pregnancy) 06/21/2022   Fast heart beat 09/21/2022   History of colon polyps 06/21/2022   Hyperlipidemia    Hypertension    Nerve root pain 06/21/2022   Neuralgia neuritis, sciatic nerve 06/21/2022   Pain in shoulder 06/21/2022   Tick bite 06/21/2022    Past Surgical History:  Procedure Laterality Date   CESAREAN SECTION     CHOLECYSTECTOMY  2007   COLONOSCOPY WITH PROPOFOL N/A 07/20/2019   Procedure: COLONOSCOPY WITH PROPOFOL;  Surgeon: Toledo, Boykin Nearing, MD;  Location: [facility] ENDOSCOPY;  Service: Gastroenterology;  Laterality: N/A;   gall bladder removed     WISDOM TOOTH EXTRACTION      Social History   Socioeconomic History   Marital status: Single    Spouse name: Not on file   Number of children: Not on  file   Years of education: Not on file   Highest education level: Not on file  Occupational History   Not on file  Tobacco Use   Smoking status: Never   Smokeless tobacco: Never  Vaping Use   Vaping Use: Never used  Substance and Sexual Activity   Alcohol use: No    Alcohol/week: 0.0 standard drinks of alcohol   Drug use: No   Sexual activity: Never  Other Topics Concern   Not on file  Social History Narrative   Not on file   Social Determinants of Health   Financial Resource Strain: Not on file  Food Insecurity: Not on file  Transportation Needs: Not on file  Physical Activity: Not on file  Stress: Not on file  Social Connections: Not on file  Intimate Partner Violence: Not on file    Family History  Problem Relation Age of Onset   Schizophrenia Mother        paranoid   Heart disease Father    Cancer Sister        breast   Breast cancer Sister 48   Cancer Maternal Aunt        colon    No Known Allergies  Review of Systems  Genitourinary:  Positive for frequency.  Neurological:  Positive for headaches.  All other systems reviewed and are  negative.      Objective:   BP (!) 140/90   Pulse 63   Ht 5\' 4"  (1.626 m)   Wt 176 lb 12.8 oz (80.2 kg)   LMP  (LMP Unknown)   SpO2 98%   BMI 30.35 kg/m   Vitals:   09/21/22 0908  BP: (!) 140/90  Pulse: 63  Height: 5\' 4"  (1.626 m)  Weight: 176 lb 12.8 oz (80.2 kg)  SpO2: 98%  BMI (Calculated): 30.33    Physical Exam Vitals and nursing note reviewed.  Constitutional:      Appearance: Normal appearance. She is normal weight.  HENT:     Head: Normocephalic.  Eyes:     Extraocular Movements: Extraocular movements intact.     Conjunctiva/sclera: Conjunctivae normal.     Pupils: Pupils are equal, round, and reactive to light.  Cardiovascular:     Rate and Rhythm: Normal rate.     Pulses: Normal pulses.  Pulmonary:     Effort: Pulmonary effort is normal.  Neurological:     General: No focal deficit  present.     Mental Status: She is alert and oriented to person, place, and time. Mental status is at baseline.  Psychiatric:        Mood and Affect: Mood normal.        Behavior: Behavior normal.        Thought Content: Thought content normal.        Judgment: Judgment normal.      No results found for any visits on 09/21/22.  No results found for this or any previous visit (from the past 2160 hour(s)).     Assessment & Plan:   Problem List Items Addressed This Visit       Active Problems   Essential hypertension, benign    Blood pressure well controlled with current medications.  Continue current therapy.  Will reassess at follow up.        Relevant Orders   CBC With Differential   CMP14+EGFR   Hyperlipidemia    Checking labs today.  Continue current therapy for lipid control. Will modify as needed based on labwork results.       Relevant Orders   Lipid panel   CBC With Differential   CMP14+EGFR   Type 2 diabetes mellitus with hyperglycemia, without long-term current use of insulin (HCC)    Checking labs today. Will call pt. With results  Patient given samples of Farxiga.  Sending RX of Metformin for her.  Will adjust meds if needed based on labs.       Relevant Medications   metFORMIN (GLUCOPHAGE) 500 MG tablet   Other Relevant Orders   CBC With Differential   CMP14+EGFR   Hemoglobin A1c   Vitamin B12 deficiency - Primary    Checking labs today.  Will continue supplements as needed.       Relevant Orders   CBC With Differential   CMP14+EGFR   Vitamin B12   Avitaminosis D    Checking labs today.  Will continue supplements as needed.       Other Visit Diagnoses     Other fatigue       Relevant Orders   CBC With Differential   CMP14+EGFR   TSH   BMI 30.0-30.9,adult           Return in about 3 months (around 12/22/2022).   Total time spent: 30 minutes  Miki Kins, FNP  09/21/2022   This document may have  been prepared by  [employer] and as such may include unintentional dictation errors.

## 2022-09-21 NOTE — Assessment & Plan Note (Signed)
Checking labs today.  Will continue supplements as needed.  

## 2022-09-22 LAB — LIPID PANEL
Chol/HDL Ratio: 2.9 ratio (ref 0.0–4.4)
Cholesterol, Total: 132 mg/dL (ref 100–199)
HDL: 45 mg/dL (ref >39)
LDL Chol Calc (NIH): 61 mg/dL (ref 0–99)
Triglycerides: 153 mg/dL — ABNORMAL HIGH (ref 0–149)
VLDL Cholesterol Cal: 26 mg/dL (ref 5–40)

## 2022-09-22 LAB — CMP14+EGFR
ALT: 14 IU/L (ref 0–32)
AST: 14 IU/L (ref 0–40)
Albumin: 4.6 g/dL (ref 3.9–4.9)
Alkaline Phosphatase: 70 IU/L (ref 44–121)
BUN/Creatinine Ratio: 18 (ref 12–28)
BUN: 13 mg/dL (ref 8–27)
Bilirubin Total: 0.4 mg/dL (ref 0.0–1.2)
CO2: 26 mmol/L (ref 20–29)
Calcium: 9.8 mg/dL (ref 8.7–10.3)
Chloride: 105 mmol/L (ref 96–106)
Creatinine, Ser: 0.74 mg/dL (ref 0.57–1.00)
Globulin, Total: 2.4 g/dL (ref 1.5–4.5)
Glucose: 113 mg/dL — ABNORMAL HIGH (ref 70–99)
Potassium: 4.6 mmol/L (ref 3.5–5.2)
Sodium: 143 mmol/L (ref 134–144)
Total Protein: 7 g/dL (ref 6.0–8.5)
eGFR: 92 mL/min/{1.73_m2} (ref >59)

## 2022-09-22 LAB — CBC WITH DIFFERENTIAL
Basophils Absolute: 0 10*3/uL (ref 0.0–0.2)
Basos: 0 %
EOS (ABSOLUTE): 0.1 10*3/uL (ref 0.0–0.4)
Eos: 1 %
Hematocrit: 42.3 % (ref 34.0–46.6)
Hemoglobin: 14.1 g/dL (ref 11.1–15.9)
Immature Grans (Abs): 0 10*3/uL (ref 0.0–0.1)
Immature Granulocytes: 0 %
Lymphocytes Absolute: 2 10*3/uL (ref 0.7–3.1)
Lymphs: 31 %
MCH: 30.7 pg (ref 26.6–33.0)
MCHC: 33.3 g/dL (ref 31.5–35.7)
MCV: 92 fL (ref 79–97)
Monocytes Absolute: 0.5 10*3/uL (ref 0.1–0.9)
Monocytes: 8 %
Neutrophils Absolute: 3.8 10*3/uL (ref 1.4–7.0)
Neutrophils: 60 %
RBC: 4.6 x10E6/uL (ref 3.77–5.28)
RDW: 12.8 % (ref 11.7–15.4)
WBC: 6.4 10*3/uL (ref 3.4–10.8)

## 2022-09-22 LAB — VITAMIN B12: Vitamin B-12: 469 pg/mL (ref 232–1245)

## 2022-09-22 LAB — HEMOGLOBIN A1C
Est. average glucose Bld gHb Est-mCnc: 134 mg/dL
Hgb A1c MFr Bld: 6.3 % — ABNORMAL HIGH (ref 4.8–5.6)

## 2022-09-22 LAB — TSH: TSH: 1.35 u[IU]/mL (ref 0.450–4.500)

## 2022-09-22 LAB — VITAMIN D 25 HYDROXY (VIT D DEFICIENCY, FRACTURES): Vit D, 25-Hydroxy: 27 ng/mL — ABNORMAL LOW (ref 30.0–100.0)

## 2022-10-02 ENCOUNTER — Encounter: Payer: Self-pay | Admitting: Family

## 2022-10-05 ENCOUNTER — Other Ambulatory Visit: Payer: Self-pay | Admitting: Family

## 2022-10-05 ENCOUNTER — Other Ambulatory Visit: Payer: Self-pay

## 2022-10-05 MED ORDER — DAPAGLIFLOZIN PROPANEDIOL 10 MG PO TABS: 10.0000 mg | ORAL_TABLET | Freq: Every day | ORAL | 3 refills | Status: DC

## 2022-10-16 ENCOUNTER — Other Ambulatory Visit: Payer: Self-pay | Admitting: Family

## 2022-10-26 MED ORDER — EMPAGLIFLOZIN 10 MG PO TABS: 10.0000 mg | ORAL_TABLET | Freq: Every day | ORAL | 3 refills | Status: AC

## 2022-10-26 NOTE — Addendum Note (Signed)
Addended by: Grayling Congress on: 10/26/2022 11:39 AM   Modules accepted: Orders

## 2022-11-05 ENCOUNTER — Other Ambulatory Visit: Payer: Self-pay | Admitting: Family

## 2022-11-05 DIAGNOSIS — N3281 Overactive bladder: Secondary | ICD-10-CM

## 2022-12-12 ENCOUNTER — Other Ambulatory Visit: Payer: Self-pay | Admitting: Family

## 2022-12-12 DIAGNOSIS — N3281 Overactive bladder: Secondary | ICD-10-CM

## 2022-12-14 ENCOUNTER — Other Ambulatory Visit: Payer: Self-pay | Admitting: Family

## 2022-12-21 ENCOUNTER — Ambulatory Visit: Payer: 59 | Admitting: Family

## 2022-12-21 ENCOUNTER — Other Ambulatory Visit: Payer: Self-pay

## 2022-12-21 ENCOUNTER — Encounter: Payer: Self-pay | Admitting: Family

## 2022-12-21 VITALS — BP 140/90 | HR 81 | Ht 64.0 in | Wt 186.6 lb

## 2022-12-21 DIAGNOSIS — E7800 Pure hypercholesterolemia, unspecified: Secondary | ICD-10-CM | POA: Diagnosis not present

## 2022-12-21 DIAGNOSIS — Z114 Encounter for screening for human immunodeficiency virus [HIV]: Secondary | ICD-10-CM | POA: Diagnosis not present

## 2022-12-21 DIAGNOSIS — R5383 Other fatigue: Secondary | ICD-10-CM | POA: Diagnosis not present

## 2022-12-21 DIAGNOSIS — E538 Deficiency of other specified B group vitamins: Secondary | ICD-10-CM | POA: Diagnosis not present

## 2022-12-21 DIAGNOSIS — E1165 Type 2 diabetes mellitus with hyperglycemia: Secondary | ICD-10-CM | POA: Diagnosis not present

## 2022-12-21 DIAGNOSIS — E559 Vitamin D deficiency, unspecified: Secondary | ICD-10-CM | POA: Diagnosis not present

## 2022-12-21 DIAGNOSIS — I10 Essential (primary) hypertension: Secondary | ICD-10-CM | POA: Diagnosis not present

## 2022-12-21 DIAGNOSIS — F3342 Major depressive disorder, recurrent, in full remission: Secondary | ICD-10-CM

## 2022-12-21 LAB — POCT CBG (FASTING - GLUCOSE)-MANUAL ENTRY: Glucose Fasting, POC: 176 mg/dL — AB (ref 70–99)

## 2022-12-21 LAB — POC CREATINE & ALBUMIN,URINE
Albumin/Creatinine Ratio, Urine, POC: <30
Creatinine, POC: 50 mg/dL
Microalbumin Ur, POC: 10 mg/L

## 2022-12-21 MED ORDER — CLINDAMYCIN HCL 300 MG PO CAPS: 300.0000 mg | ORAL_CAPSULE | Freq: Two times a day (BID) | ORAL | 0 refills | Status: DC

## 2022-12-21 MED ORDER — METOPROLOL TARTRATE 50 MG PO TABS: 50.0000 mg | ORAL_TABLET | Freq: Two times a day (BID) | ORAL | 2 refills | Status: DC

## 2022-12-21 NOTE — Progress Notes (Signed)
Established Patient Office Visit  Subjective:  Patient ID: Madison Price, female    DOB: 05-May-1960  Age: 62 y.o. MRN: [id number]  Chief Complaint  Patient presents with   Follow-up    3 mo f/u  [place] from dental work, broken tooth causing sore throat and ear pain- pt is requesting an abx    Patient is here today for her 3 months follow up.  She has been feeling fairly well since last appointment.   She does have additional concerns to discuss today.  She has been having some headaches and dental infections, and asks if we can send something to help with this.   Labs are due today. She needs refills.   I have reviewed her active problem list, medication list, allergies, health maintenance, notes from last encounter, lab results for her appointment today.      No other concerns at this time.   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   Anxiety    Arm bruise 09/21/2022   Axillary lump 09/21/2022   Cervical radiculitis 07/27/2014   Conjunctivitis 06/21/2022   Cough with expectoration 06/21/2022   Depression    Diabetes mellitus without complication (HCC)    Eclampsia (toxemia of pregnancy) 06/21/2022   Fast heart beat 09/21/2022   History of colon polyps 06/21/2022   Hyperlipidemia    Hypertension    Nerve root pain 06/21/2022   Neuralgia neuritis, sciatic nerve 06/21/2022   Pain in shoulder 06/21/2022   Tick bite 06/21/2022   Well woman exam with routine gynecological exam 07/20/2016    Past Surgical History:  Procedure Laterality Date   CESAREAN SECTION     CHOLECYSTECTOMY  2007   COLONOSCOPY WITH PROPOFOL N/A 07/20/2019   Procedure: COLONOSCOPY WITH PROPOFOL;  Surgeon: Toledo, Boykin Nearing, MD;  Location: [facility] ENDOSCOPY;  Service: Gastroenterology;  Laterality: N/A;   gall bladder removed     WISDOM TOOTH EXTRACTION       Social History   Socioeconomic History   Marital status: Single    Spouse name: Not on file   Number of children: Not on file   Years of education: Not on file   Highest education level: Not on file  Occupational History   Not on file  Tobacco Use   Smoking status: Never   Smokeless tobacco: Never  Vaping Use   Vaping status: Never Used  Substance and Sexual Activity   Alcohol use: No    Alcohol/week: 0.0 standard drinks of alcohol   Drug use: No   Sexual activity: Never  Other Topics Concern   Not on file  Social History Narrative   Not on file   Social Determinants of Health   Financial Resource Strain: Not on file  Food Insecurity: Not on file  Transportation Needs: Not on file  Physical Activity: Not on file  Stress: Not on file  Social Connections: Not on file  Intimate Partner Violence: Not on file    Family History  Problem Relation Age of Onset   Schizophrenia Mother        paranoid   Heart disease Father    Cancer Sister        breast   Breast cancer Sister 51   Cancer Maternal Aunt        colon    No Known Allergies  Review of Systems  HENT:  Dental pain  Neurological:  Positive for headaches.  All other systems reviewed and are negative.      Objective:   BP (!) 140/90 (BP Location: Right Arm)   Pulse 81   Ht 5\' 4"  (1.626 m)   Wt 186 lb 9.6 oz (84.6 kg)   LMP  (LMP Unknown)   SpO2 95%   BMI 32.03 kg/m   Vitals:   12/21/22 1021  BP: (!) 140/90  Pulse: 81  Height: 5\' 4"  (1.626 m)  Weight: 186 lb 9.6 oz (84.6 kg)  SpO2: 95%  BMI (Calculated): 32.01    Physical Exam Vitals and nursing note reviewed.  Constitutional:      Appearance: Normal appearance. She is normal weight.  HENT:     Head: Normocephalic.  Eyes:     Extraocular Movements: Extraocular movements intact.     Conjunctiva/sclera: Conjunctivae normal.     Pupils: Pupils are equal, round, and reactive to light.  Cardiovascular:     Rate and Rhythm:  Normal rate.  Pulmonary:     Effort: Pulmonary effort is normal.  Neurological:     General: No focal deficit present.     Mental Status: She is alert and oriented to person, place, and time. Mental status is at baseline.  Psychiatric:        Mood and Affect: Mood normal.        Behavior: Behavior normal.        Thought Content: Thought content normal.        Judgment: Judgment normal.      Results for orders placed or performed in visit on 12/21/22  Lipid panel  Result Value Ref Range   Cholesterol, Total 124 100 - 199 mg/dL   Triglycerides 409 (H) 0 - 149 mg/dL   HDL 39 (L) >81 mg/dL   VLDL Cholesterol Cal 36 5 - 40 mg/dL   LDL Chol Calc (NIH) 49 0 - 99 mg/dL   Chol/HDL Ratio 3.2 0.0 - 4.4 ratio  VITAMIN D 25 Hydroxy (Vit-D Deficiency, Fractures)  Result Value Ref Range   Vit D, 25-Hydroxy 25.8 (L) 30.0 - 100.0 ng/mL  CMP14+EGFR  Result Value Ref Range   Glucose 159 (H) 70 - 99 mg/dL   BUN 12 8 - 27 mg/dL   Creatinine, Ser 1.91 0.57 - 1.00 mg/dL   eGFR 95 >47 WG/NFA/2.13   BUN/Creatinine Ratio 17 12 - 28   Sodium 143 134 - 144 mmol/L   Potassium 4.4 3.5 - 5.2 mmol/L   Chloride 102 96 - 106 mmol/L   CO2 22 20 - 29 mmol/L   Calcium 9.7 8.7 - 10.3 mg/dL   Total Protein 7.0 6.0 - 8.5 g/dL   Albumin 4.6 3.9 - 4.9 g/dL   Globulin, Total 2.4 1.5 - 4.5 g/dL   Bilirubin Total 0.3 0.0 - 1.2 mg/dL   Alkaline Phosphatase 75 44 - 121 IU/L   AST 22 0 - 40 IU/L   ALT 34 (H) 0 - 32 IU/L  TSH  Result Value Ref Range   TSH 1.540 0.450 - 4.500 uIU/mL  Hemoglobin A1c  Result Value Ref Range   Hgb A1c MFr Bld 7.5 (H) 4.8 - 5.6 %   Est. average glucose Bld gHb Est-mCnc 169 mg/dL  Vitamin Y86  Result Value Ref Range   Vitamin B-12 397 232 - 1,245 pg/mL  HIV antibody (with reflex)  Result Value Ref Range   HIV Screen 4th Generation wRfx Non Reactive Non Reactive  POCT  CBG (Fasting - Glucose)  Result Value Ref Range   Glucose Fasting, POC 176 (A) 70 - 99 mg/dL  POC CREATINE &  ALBUMIN,URINE  Result Value Ref Range   Microalbumin Ur, POC 10 mg/L   Creatinine, POC 50 mg/dL   Albumin/Creatinine Ratio, Urine, POC <30     Recent Results (from the past 2160 hour(s))  POCT CBG (Fasting - Glucose)     Status: Abnormal   Collection Time: 12/21/22 10:27 AM  Result Value Ref Range   Glucose Fasting, POC 176 (A) 70 - 99 mg/dL  Lipid panel     Status: Abnormal   Collection Time: 12/21/22 11:03 AM  Result Value Ref Range   Cholesterol, Total 124 100 - 199 mg/dL   Triglycerides 161 (H) 0 - 149 mg/dL   HDL 39 (L) >09 mg/dL   VLDL Cholesterol Cal 36 5 - 40 mg/dL   LDL Chol Calc (NIH) 49 0 - 99 mg/dL   Chol/HDL Ratio 3.2 0.0 - 4.4 ratio    Comment:                                   T. Chol/HDL Ratio                                             Men  Women                               1/2 Avg.Risk  3.4    3.3                                   Avg.Risk  5.0    4.4                                2X Avg.Risk  9.6    7.1                                3X Avg.Risk 23.4   11.0   VITAMIN D 25 Hydroxy (Vit-D Deficiency, Fractures)     Status: Abnormal   Collection Time: 12/21/22 11:03 AM  Result Value Ref Range   Vit D, 25-Hydroxy 25.8 (L) 30.0 - 100.0 ng/mL    Comment: Vitamin D deficiency has been defined by the Institute of Medicine and an Endocrine Society practice guideline as a level of serum 25-OH vitamin D less than 20 ng/mL (1,2). The Endocrine Society went on to further define vitamin D insufficiency as a level between 21 and 29 ng/mL (2). 1. IOM (Institute of Medicine). 2010. Dietary reference    intakes for calcium and D. [place]: The    [employer]. 2. Holick MF, Binkley [place], Bischoff-Ferrari HA, et al.    Evaluation, treatment, and prevention of vitamin D    deficiency: an Endocrine Society clinical practice    guideline. JCEM. 2011 Jul; 96(7):1911-30.   CMP14+EGFR     Status: Abnormal   Collection Time: 12/21/22 11:03 AM  Result Value  Ref Range   Glucose 159 (H) 70 - 99 mg/dL  BUN 12 8 - 27 mg/dL   Creatinine, Ser 1.91 0.57 - 1.00 mg/dL   eGFR 95 >47 WG/NFA/2.13   BUN/Creatinine Ratio 17 12 - 28   Sodium 143 134 - 144 mmol/L   Potassium 4.4 3.5 - 5.2 mmol/L   Chloride 102 96 - 106 mmol/L   CO2 22 20 - 29 mmol/L   Calcium 9.7 8.7 - 10.3 mg/dL   Total Protein 7.0 6.0 - 8.5 g/dL   Albumin 4.6 3.9 - 4.9 g/dL   Globulin, Total 2.4 1.5 - 4.5 g/dL   Bilirubin Total 0.3 0.0 - 1.2 mg/dL   Alkaline Phosphatase 75 44 - 121 IU/L   AST 22 0 - 40 IU/L   ALT 34 (H) 0 - 32 IU/L  TSH     Status: None   Collection Time: 12/21/22 11:03 AM  Result Value Ref Range   TSH 1.540 0.450 - 4.500 uIU/mL  Hemoglobin A1c     Status: Abnormal   Collection Time: 12/21/22 11:03 AM  Result Value Ref Range   Hgb A1c MFr Bld 7.5 (H) 4.8 - 5.6 %    Comment:          Prediabetes: 5.7 - 6.4          Diabetes: >6.4          Glycemic control for adults with diabetes: <7.0    Est. average glucose Bld gHb Est-mCnc 169 mg/dL  Vitamin Y86     Status: None   Collection Time: 12/21/22 11:03 AM  Result Value Ref Range   Vitamin B-12 397 232 - 1,245 pg/mL  HIV antibody (with reflex)     Status: None   Collection Time: 12/21/22 11:03 AM  Result Value Ref Range   HIV Screen 4th Generation wRfx Non Reactive Non Reactive    Comment: HIV-1/HIV-2 antibodies and HIV-1 p24 antigen were NOT detected. There is no laboratory evidence of HIV infection. HIV Negative   POC CREATINE & ALBUMIN,URINE     Status: None   Collection Time: 12/21/22  1:42 PM  Result Value Ref Range   Microalbumin Ur, POC 10 mg/L   Creatinine, POC 50 mg/dL   Albumin/Creatinine Ratio, Urine, POC <30        Assessment & Plan:   Problem List Items Addressed This Visit       Active Problems   Essential hypertension, benign    Blood pressure elevated today, though normally is well controlled.  Likely 2/2 dental infection/pain.   Will recheck at follow up appointment.   Continue current meds       Relevant Orders   CMP14+EGFR (Completed)   CBC with Differential/Platelet   Hyperlipidemia    Checking labs today.  Continue current therapy for lipid control. Will modify as needed based on labwork results.       Relevant Orders   Lipid panel (Completed)   CMP14+EGFR (Completed)   CBC with Differential/Platelet   Type 2 diabetes mellitus with hyperglycemia, without long-term current use of insulin (HCC) - Primary    Checking labs today. Will call pt. With results  Continue current diabetes POC, as patient has been well controlled on current regimen.  Will adjust meds if needed based on labs.        Relevant Orders   POCT CBG (Fasting - Glucose) (Completed)   CMP14+EGFR (Completed)   Hemoglobin A1c (Completed)   CBC with Differential/Platelet   Ambulatory referral to Ophthalmology   POC CREATINE & ALBUMIN,URINE (Completed)   Vitamin  B12 deficiency    Checking labs today.  Will continue supplements as needed.        Relevant Orders   CMP14+EGFR (Completed)   Vitamin B12 (Completed)   CBC with Differential/Platelet   Avitaminosis D    Checking labs today.  Will continue supplements as needed.        Relevant Orders   VITAMIN D 25 Hydroxy (Vit-D Deficiency, Fractures) (Completed)   CMP14+EGFR (Completed)   CBC with Differential/Platelet   Recurrent major depressive disorder, in full remission (HCC)    Patient stable.  Well controlled with current therapy.   Continue current meds.       Relevant Orders   CMP14+EGFR (Completed)   CBC with Differential/Platelet   Other Visit Diagnoses     Other fatigue       Relevant Orders   CMP14+EGFR (Completed)   TSH (Completed)   CBC with Differential/Platelet   Encounter for screening for HIV       Test ordered today. Will call with results.   Relevant Orders   HIV antibody (with reflex) (Completed)       Return in about 3 months (around 03/22/2023).   Total time spent: 30  minutes  Miki Kins, FNP  12/21/2022   This document may have been prepared by [facility] Voice Recognition software and as such may include unintentional dictation errors.

## 2022-12-22 LAB — LIPID PANEL
Chol/HDL Ratio: 3.2 {ratio} (ref 0.0–4.4)
Cholesterol, Total: 124 mg/dL (ref 100–199)
HDL: 39 mg/dL — ABNORMAL LOW (ref >39)
LDL Chol Calc (NIH): 49 mg/dL (ref 0–99)
Triglycerides: 226 mg/dL — ABNORMAL HIGH (ref 0–149)
VLDL Cholesterol Cal: 36 mg/dL (ref 5–40)

## 2022-12-22 LAB — CMP14+EGFR
ALT: 34 [IU]/L — ABNORMAL HIGH (ref 0–32)
AST: 22 [IU]/L (ref 0–40)
Albumin: 4.6 g/dL (ref 3.9–4.9)
Alkaline Phosphatase: 75 [IU]/L (ref 44–121)
BUN/Creatinine Ratio: 17 (ref 12–28)
BUN: 12 mg/dL (ref 8–27)
Bilirubin Total: 0.3 mg/dL (ref 0.0–1.2)
CO2: 22 mmol/L (ref 20–29)
Calcium: 9.7 mg/dL (ref 8.7–10.3)
Chloride: 102 mmol/L (ref 96–106)
Creatinine, Ser: 0.72 mg/dL (ref 0.57–1.00)
Globulin, Total: 2.4 g/dL (ref 1.5–4.5)
Glucose: 159 mg/dL — ABNORMAL HIGH (ref 70–99)
Potassium: 4.4 mmol/L (ref 3.5–5.2)
Sodium: 143 mmol/L (ref 134–144)
Total Protein: 7 g/dL (ref 6.0–8.5)
eGFR: 95 mL/min/{1.73_m2} (ref >59)

## 2022-12-22 LAB — VITAMIN D 25 HYDROXY (VIT D DEFICIENCY, FRACTURES): Vit D, 25-Hydroxy: 25.8 ng/mL — ABNORMAL LOW (ref 30.0–100.0)

## 2022-12-22 LAB — HEMOGLOBIN A1C
Est. average glucose Bld gHb Est-mCnc: 169 mg/dL
Hgb A1c MFr Bld: 7.5 % — ABNORMAL HIGH (ref 4.8–5.6)

## 2022-12-22 LAB — VITAMIN B12: Vitamin B-12: 397 pg/mL (ref 232–1245)

## 2022-12-22 LAB — HIV ANTIBODY (ROUTINE TESTING W REFLEX): HIV Screen 4th Generation wRfx: NONREACTIVE

## 2022-12-22 LAB — TSH: TSH: 1.54 u[IU]/mL (ref 0.450–4.500)

## 2022-12-27 ENCOUNTER — Encounter: Payer: Self-pay | Admitting: Family

## 2022-12-28 MED ORDER — HYDROCODONE-ACETAMINOPHEN 5-325 MG PO TABS: 1.0000 | ORAL_TABLET | Freq: Four times a day (QID) | ORAL | 0 refills | Status: AC | PRN

## 2022-12-28 MED ORDER — IBUPROFEN 800 MG PO TABS: 800.0000 mg | ORAL_TABLET | Freq: Three times a day (TID) | ORAL | 0 refills | Status: DC | PRN

## 2023-01-02 ENCOUNTER — Other Ambulatory Visit: Payer: Self-pay | Admitting: Family

## 2023-01-02 ENCOUNTER — Encounter: Payer: Self-pay | Admitting: Family

## 2023-01-03 MED ORDER — LOSARTAN POTASSIUM 100 MG PO TABS: 100.0000 mg | ORAL_TABLET | Freq: Every day | ORAL | 0 refills | Status: DC

## 2023-01-10 ENCOUNTER — Other Ambulatory Visit: Payer: Self-pay | Admitting: Family

## 2023-01-10 DIAGNOSIS — N3281 Overactive bladder: Secondary | ICD-10-CM

## 2023-01-25 ENCOUNTER — Other Ambulatory Visit: Payer: Self-pay | Admitting: Family

## 2023-01-27 ENCOUNTER — Encounter: Payer: Self-pay | Admitting: Family

## 2023-01-27 NOTE — Assessment & Plan Note (Signed)
Checking labs today.  Will continue supplements as needed.  

## 2023-01-27 NOTE — Assessment & Plan Note (Signed)
Blood pressure elevated today, though normally is well controlled.  Likely 2/2 dental infection/pain.   Will recheck at follow up appointment.  Continue current meds

## 2023-01-27 NOTE — Assessment & Plan Note (Signed)
Checking labs today.  Continue current therapy for lipid control. Will modify as needed based on labwork results.  

## 2023-01-27 NOTE — Assessment & Plan Note (Signed)
Checking labs today. Will call pt. With results  Continue current diabetes POC, as patient has been well controlled on current regimen.  Will adjust meds if needed based on labs.  

## 2023-01-27 NOTE — Assessment & Plan Note (Signed)
Patient stable.  Well controlled with current therapy.   Continue current meds.  

## 2023-01-29 ENCOUNTER — Other Ambulatory Visit: Payer: Self-pay | Admitting: Family

## 2023-01-29 MED ORDER — GLIMEPIRIDE 1 MG PO TABS: 1.0000 mg | ORAL_TABLET | Freq: Every day | ORAL | 0 refills | Status: DC

## 2023-01-29 MED ORDER — LOSARTAN POTASSIUM 100 MG PO TABS: 100.0000 mg | ORAL_TABLET | Freq: Every day | ORAL | 1 refills | Status: DC

## 2023-01-29 NOTE — Addendum Note (Signed)
Addended by: Grayling Congress on: 01/29/2023 09:51 AM   Modules accepted: Orders

## 2023-02-06 ENCOUNTER — Encounter: Payer: Self-pay | Admitting: Family

## 2023-02-07 ENCOUNTER — Other Ambulatory Visit: Payer: Self-pay | Admitting: Family

## 2023-02-07 DIAGNOSIS — N3281 Overactive bladder: Secondary | ICD-10-CM

## 2023-02-19 ENCOUNTER — Other Ambulatory Visit: Payer: Self-pay | Admitting: Family

## 2023-03-06 ENCOUNTER — Other Ambulatory Visit: Payer: Self-pay | Admitting: Family

## 2023-03-06 DIAGNOSIS — N3281 Overactive bladder: Secondary | ICD-10-CM

## 2023-03-21 ENCOUNTER — Other Ambulatory Visit: Payer: Self-pay

## 2023-03-21 MED ORDER — METOPROLOL TARTRATE 50 MG PO TABS: 50.0000 mg | ORAL_TABLET | Freq: Two times a day (BID) | ORAL | 2 refills | Status: DC

## 2023-03-22 ENCOUNTER — Ambulatory Visit: Payer: 59 | Admitting: Family

## 2023-03-22 ENCOUNTER — Other Ambulatory Visit: Payer: Self-pay | Admitting: Family

## 2023-03-26 ENCOUNTER — Ambulatory Visit: Payer: 59 | Admitting: Family

## 2023-04-02 ENCOUNTER — Other Ambulatory Visit: Payer: Self-pay | Admitting: Family

## 2023-04-02 ENCOUNTER — Encounter: Payer: Self-pay | Admitting: Family

## 2023-04-02 ENCOUNTER — Ambulatory Visit: Payer: Self-pay | Admitting: Family

## 2023-04-02 VITALS — BP 124/86 | HR 66 | Ht 64.0 in | Wt 192.0 lb

## 2023-04-02 DIAGNOSIS — E559 Vitamin D deficiency, unspecified: Secondary | ICD-10-CM

## 2023-04-02 DIAGNOSIS — E538 Deficiency of other specified B group vitamins: Secondary | ICD-10-CM

## 2023-04-02 DIAGNOSIS — E7800 Pure hypercholesterolemia, unspecified: Secondary | ICD-10-CM

## 2023-04-02 DIAGNOSIS — N3281 Overactive bladder: Secondary | ICD-10-CM

## 2023-04-02 DIAGNOSIS — I10 Essential (primary) hypertension: Secondary | ICD-10-CM

## 2023-04-02 DIAGNOSIS — F3342 Major depressive disorder, recurrent, in full remission: Secondary | ICD-10-CM

## 2023-04-02 DIAGNOSIS — E1165 Type 2 diabetes mellitus with hyperglycemia: Secondary | ICD-10-CM

## 2023-04-02 MED ORDER — TRULICITY 0.75 MG/0.5ML ~~LOC~~ SOAJ: 0.7500 mg | SUBCUTANEOUS | 0 refills | Status: AC

## 2023-04-02 NOTE — Assessment & Plan Note (Signed)
 Patient stable.  Well controlled with current therapy.   Continue current meds.

## 2023-04-02 NOTE — Assessment & Plan Note (Signed)
 Blood pressure well controlled with current medications.  Continue current therapy.  Will reassess at follow up.

## 2023-04-02 NOTE — Assessment & Plan Note (Signed)
 Checking labs today.  Will continue supplements as needed.

## 2023-04-02 NOTE — Assessment & Plan Note (Signed)
 Checking labs today. Will call pt. With results  Continue current diabetes POC, as patient has been well controlled on current regimen.  Will adjust meds if needed based on labs.

## 2023-04-02 NOTE — Assessment & Plan Note (Signed)
 Checking labs today.  Continue current therapy for lipid control. Will modify as needed based on labwork results.

## 2023-04-02 NOTE — Progress Notes (Signed)
 Established Patient Office Visit  Subjective:  Patient ID: Madison Price, female    DOB: 11-Nov-1960  Age: 63 y.o. MRN: [id number]  Chief Complaint  Patient presents with   Follow-up    3 month follow up    Patient is here today for her 3 months follow up.  She has been feeling fairly well since last appointment.   She does have additional concerns to discuss today.  Has new insurance, so she asks if we can get her a PA done for the Trulicity  with this new insurance.   Labs are due today. She needs refills.   I have reviewed her active problem list, medication list, allergies, health maintenance, notes from last encounter, lab results for her appointment today.      No other concerns at this time.   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   Anxiety    Arm bruise 09/21/2022   Axillary lump 09/21/2022   Cervical radiculitis 07/27/2014   Conjunctivitis 06/21/2022   Cough with expectoration 06/21/2022   Depression    Diabetes mellitus without complication (HCC)    Eclampsia (toxemia of pregnancy) 06/21/2022   Fast heart beat 09/21/2022   History of colon polyps 06/21/2022   Hyperlipidemia    Hypertension    Nerve root pain 06/21/2022   Neuralgia neuritis, sciatic nerve 06/21/2022   Pain in shoulder 06/21/2022   Tick bite 06/21/2022   Well woman exam with routine gynecological exam 07/20/2016    Past Surgical History:  Procedure Laterality Date   CESAREAN SECTION     CHOLECYSTECTOMY  2007   COLONOSCOPY WITH PROPOFOL  N/A 07/20/2019   Procedure: COLONOSCOPY WITH PROPOFOL ;  Surgeon: Toledo, Ladell POUR, MD;  Location: [facility] ENDOSCOPY;  Service: Gastroenterology;  Laterality: N/A;   gall bladder removed     WISDOM TOOTH EXTRACTION      Social History   Socioeconomic History   Marital status: Single    Spouse name: Not on file    Number of children: Not on file   Years of education: Not on file   Highest education level: Not on file  Occupational History   Not on file  Tobacco Use   Smoking status: Never   Smokeless tobacco: Never  Vaping Use   Vaping status: Never Used  Substance and Sexual Activity   Alcohol use: No    Alcohol/week: 0.0 standard drinks of alcohol   Drug use: No   Sexual activity: Never  Other Topics Concern   Not on file  Social History Narrative   Not on file   Social Drivers of Health   Financial Resource Strain: Not on file  Food Insecurity: Not on file  Transportation Needs: Not on file  Physical Activity: Not on file  Stress: Not on file  Social Connections: Not on file  Intimate Partner Violence: Not on file    Family History  Problem Relation Age of Onset   Schizophrenia Mother        paranoid   Heart disease Father    Cancer Sister        breast   Breast cancer Sister 53   Cancer Maternal Aunt        colon    No Known Allergies  Review of Systems  All other systems reviewed and are negative.      Objective:   BP 124/86  Pulse 66   Ht 5' 4 (1.626 m)   Wt 192 lb (87.1 kg)   LMP  (LMP Unknown)   SpO2 97%   BMI 32.96 kg/m   Vitals:   04/02/23 0928  BP: 124/86  Pulse: 66  Height: 5' 4 (1.626 m)  Weight: 192 lb (87.1 kg)  SpO2: 97%  BMI (Calculated): 32.94    Physical Exam Vitals and nursing note reviewed.  Constitutional:      Appearance: Normal appearance. She is normal weight.  HENT:     Head: Normocephalic.  Eyes:     Extraocular Movements: Extraocular movements intact.     Conjunctiva/sclera: Conjunctivae normal.     Pupils: Pupils are equal, round, and reactive to light.  Cardiovascular:     Rate and Rhythm: Normal rate.  Pulmonary:     Effort: Pulmonary effort is normal.  Neurological:     General: No focal deficit present.     Mental Status: She is alert and oriented to person, place, and time. Mental status is at baseline.   Psychiatric:        Mood and Affect: Mood normal.        Behavior: Behavior normal.        Thought Content: Thought content normal.      No results found for any visits on 04/02/23.  No results found for this or any previous visit (from the past 2160 hours).     Assessment & Plan:   Problem List Items Addressed This Visit       Cardiovascular and Mediastinum   Essential hypertension, benign - Primary   Blood pressure well controlled with current medications.  Continue current therapy.  Will reassess at follow up.          Endocrine   Type 2 diabetes mellitus with hyperglycemia, without long-term current use of insulin (HCC)   Checking labs today. Will call pt. With results  Continue current diabetes POC, as patient has been well controlled on current regimen.  Will adjust meds if needed based on labs.        Relevant Medications   Dulaglutide  (TRULICITY ) 0.75 MG/0.5ML SOAJ     Genitourinary   OAB (overactive bladder)   Patient stable.  Well controlled with current therapy.   Continue current meds.          Other   Hyperlipidemia   Checking labs today.  Continue current therapy for lipid control. Will modify as needed based on labwork results.        Vitamin B12 deficiency   Checking labs today.  Will continue supplements as needed.        Avitaminosis D   Checking labs today.  Will continue supplements as needed.        Recurrent major depressive disorder, in full remission (HCC)   Patient stable.  Well controlled with current therapy.   Continue current meds.         Return in about 4 months (around 07/31/2023).   Total time spent: 20 minutes  ALAN CHRISTELLA ARRANT, FNP  04/02/2023   This document may have been prepared by [facility] Voice Recognition software and as such may include unintentional dictation errors.

## 2023-04-03 ENCOUNTER — Encounter: Payer: Self-pay | Admitting: Family

## 2023-04-05 ENCOUNTER — Telehealth: Payer: Self-pay

## 2023-04-05 DIAGNOSIS — E7800 Pure hypercholesterolemia, unspecified: Secondary | ICD-10-CM

## 2023-04-05 DIAGNOSIS — E1165 Type 2 diabetes mellitus with hyperglycemia: Secondary | ICD-10-CM

## 2023-04-05 DIAGNOSIS — Z6830 Body mass index (BMI) 30.0-30.9, adult: Secondary | ICD-10-CM

## 2023-04-05 DIAGNOSIS — I10 Essential (primary) hypertension: Secondary | ICD-10-CM

## 2023-04-18 ENCOUNTER — Other Ambulatory Visit: Payer: Self-pay | Admitting: Family

## 2023-04-19 ENCOUNTER — Other Ambulatory Visit: Payer: Self-pay | Admitting: Family

## 2023-04-26 NOTE — Telephone Encounter (Signed)
 Lab order placed.

## 2023-05-06 ENCOUNTER — Other Ambulatory Visit: Payer: Self-pay | Admitting: Family

## 2023-05-06 DIAGNOSIS — N3281 Overactive bladder: Secondary | ICD-10-CM

## 2023-05-20 ENCOUNTER — Other Ambulatory Visit: Payer: Self-pay | Admitting: Family

## 2023-06-06 ENCOUNTER — Other Ambulatory Visit: Payer: Self-pay | Admitting: Family

## 2023-06-06 DIAGNOSIS — N3281 Overactive bladder: Secondary | ICD-10-CM

## 2023-07-19 ENCOUNTER — Other Ambulatory Visit: Payer: Self-pay | Admitting: Family

## 2023-07-23 ENCOUNTER — Other Ambulatory Visit: Payer: Self-pay | Admitting: Family

## 2023-07-23 DIAGNOSIS — N3281 Overactive bladder: Secondary | ICD-10-CM

## 2023-08-01 ENCOUNTER — Ambulatory Visit: Payer: Self-pay | Admitting: Family

## 2023-08-01 ENCOUNTER — Encounter: Payer: Self-pay | Admitting: Family

## 2023-08-01 ENCOUNTER — Other Ambulatory Visit

## 2023-08-01 VITALS — BP 140/90 | HR 72 | Ht 64.0 in | Wt 194.2 lb

## 2023-08-01 DIAGNOSIS — S70362A Insect bite (nonvenomous), left thigh, initial encounter: Secondary | ICD-10-CM

## 2023-08-01 DIAGNOSIS — R5383 Other fatigue: Secondary | ICD-10-CM

## 2023-08-01 DIAGNOSIS — E538 Deficiency of other specified B group vitamins: Secondary | ICD-10-CM

## 2023-08-01 DIAGNOSIS — I10 Essential (primary) hypertension: Secondary | ICD-10-CM

## 2023-08-01 DIAGNOSIS — E1165 Type 2 diabetes mellitus with hyperglycemia: Secondary | ICD-10-CM

## 2023-08-01 DIAGNOSIS — F3342 Major depressive disorder, recurrent, in full remission: Secondary | ICD-10-CM

## 2023-08-01 DIAGNOSIS — E559 Vitamin D deficiency, unspecified: Secondary | ICD-10-CM

## 2023-08-01 DIAGNOSIS — E7800 Pure hypercholesterolemia, unspecified: Secondary | ICD-10-CM

## 2023-08-01 DIAGNOSIS — M79602 Pain in left arm: Secondary | ICD-10-CM

## 2023-08-01 DIAGNOSIS — M79601 Pain in right arm: Secondary | ICD-10-CM

## 2023-08-01 DIAGNOSIS — W57XXXA Bitten or stung by nonvenomous insect and other nonvenomous arthropods, initial encounter: Secondary | ICD-10-CM

## 2023-08-01 NOTE — Progress Notes (Signed)
 Established Patient Office Visit  Subjective:  Patient ID: Madison Price, female    DOB: July 01, 1960  Age: 63 y.o. MRN: [id number]  Chief Complaint  Patient presents with   Follow-up    4 month follow up    Patient is here today for her 3 months follow up.  She has been feeling fairly well since last appointment.   She does have additional concerns to discuss today.  She has been having some weakness in her right arm. She thinks it might be related to 1 known (1 week ago), 1 possible (in March) tick bite.  Also has significant pain in her jaw, had a tooth pulled this week.   Labs are due today. She needs refills.   I have reviewed her active problem list, medication list, allergies, notes from last encounter, lab results for her appointment today.      No other concerns at this time.   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   Anxiety    Arm bruise 09/21/2022   Axillary lump 09/21/2022   Cervical radiculitis 07/27/2014   Conjunctivitis 06/21/2022   Cough with expectoration 06/21/2022   Depression    Diabetes mellitus without complication (HCC)    Eclampsia (toxemia of pregnancy) 06/21/2022   Fast heart beat 09/21/2022   History of colon polyps 06/21/2022   Hyperlipidemia    Hypertension    Nerve root pain 06/21/2022   Neuralgia neuritis, sciatic nerve 06/21/2022   Pain in shoulder 06/21/2022   Tick bite 06/21/2022   Well woman exam with routine gynecological exam 07/20/2016    Past Surgical History:  Procedure Laterality Date   CESAREAN SECTION     CHOLECYSTECTOMY  2007   COLONOSCOPY WITH PROPOFOL  N/A 07/20/2019   Procedure: COLONOSCOPY WITH PROPOFOL ;  Surgeon: Toledo, Alphonsus Jeans, MD;  Location: [facility];  Service: Gastroenterology;  Laterality: N/A;   gall bladder removed     WISDOM TOOTH EXTRACTION      Social History    Socioeconomic History   Marital status: Single    Spouse name: Not on file   Number of children: Not on file   Years of education: Not on file   Highest education level: Not on file  Occupational History   Not on file  Tobacco Use   Smoking status: Never   Smokeless tobacco: Never  Vaping Use   Vaping status: Never Used  Substance and Sexual Activity   Alcohol use: No    Alcohol/week: 0.0 standard drinks of alcohol   Drug use: No   Sexual activity: Never  Other Topics Concern   Not on file  Social History Narrative   Not on file   Social Drivers of Health   Financial Resource Strain: Not on file  Food Insecurity: Not on file  Transportation Needs: Not on file  Physical Activity: Not on file  Stress: Not on file  Social Connections: Not on file  Intimate Partner Violence: Not on file    Family History  Problem Relation Age of Onset   Schizophrenia Mother        paranoid   Heart disease Father    Cancer Sister        breast   Breast cancer Sister 48   Cancer Maternal Aunt        colon    No Known Allergies  Review of Systems  All other systems reviewed and are negative.      Objective:   BP (!) 140/90   Pulse 72   Ht 5\' 4"  (1.626 m)   Wt 194 lb 3.2 oz (88.1 kg)   LMP  (LMP Unknown)   SpO2 98%   BMI 33.33 kg/m   Vitals:   08/01/23 0902  BP: (!) 140/90  Pulse: 72  Height: 5\' 4"  (1.626 m)  Weight: 194 lb 3.2 oz (88.1 kg)  SpO2: 98%  BMI (Calculated): 33.32    Physical Exam Vitals and nursing note reviewed.  Constitutional:      Appearance: Normal appearance. She is obese.  HENT:     Head: Normocephalic.  Eyes:     Extraocular Movements: Extraocular movements intact.     Conjunctiva/sclera: Conjunctivae normal.     Pupils: Pupils are equal, round, and reactive to light.  Cardiovascular:     Rate and Rhythm: Normal rate.     Pulses: Normal pulses.     Heart sounds: Normal heart sounds.  Pulmonary:     Effort: Pulmonary effort is  normal.     Breath sounds: Normal breath sounds.  Musculoskeletal:        General: Normal range of motion.     Cervical back: Normal range of motion.  Neurological:     General: No focal deficit present.     Mental Status: She is alert and oriented to person, place, and time. Mental status is at baseline.  Psychiatric:        Mood and Affect: Mood normal.        Behavior: Behavior normal.        Thought Content: Thought content normal.        Judgment: Judgment normal.      No results found for any visits on 08/01/23.  No results found for this or any previous visit (from the past 2160 hours).     Assessment & Plan:   Problem List Items Addressed This Visit       Cardiovascular and Mediastinum   Essential hypertension, benign   Blood pressure well controlled with current medications.  Continue current therapy.  Will reassess at follow up.        Relevant Orders   CMP14+EGFR     Endocrine   Type 2 diabetes mellitus with hyperglycemia, without long-term current use of insulin (HCC)   Checking labs today. Will call pt. With results  Continue current diabetes POC, as patient has been well controlled on current regimen.  Will adjust meds if needed based on labs.        Relevant Orders   CMP14+EGFR   Hemoglobin A1c     Other   Hyperlipidemia   Checking labs today.  Continue current therapy for lipid control. Will modify as needed based on labwork results.        Relevant Orders   CMP14+EGFR   Lipid panel   Vitamin B12 deficiency   Checking labs today.  Will continue supplements as needed.        Relevant Orders   CMP14+EGFR   Vitamin B12   Avitaminosis D   Checking labs today.  Will continue supplements as needed.        Relevant Orders   CMP14+EGFR   VITAMIN D  25 Hydroxy (Vit-D Deficiency, Fractures)   Recurrent major depressive disorder, in full remission (HCC)   Patient stable.  Well controlled with current therapy.   Continue current  meds.  Relevant Orders   CMP14+EGFR   Other Visit Diagnoses       Tick bite of left thigh, initial encounter    -  Primary   Checking RMSF and Lyme labs today. Will call with results and send meds as needed   Relevant Orders   Lyme Disease Serology w/Reflex   Spotted Fever Group Antibodies     Other fatigue       Relevant Orders   CMP14+EGFR   TSH     Pain in both upper extremities       suspect possibly because of her tick bite.  Checking labs.  Will call with results.   Relevant Orders   Lyme Disease Serology w/Reflex   Spotted Fever Group Antibodies       Return in about 4 months (around 12/02/2023).   Total time spent: 20 minutes  Trenda Frisk, FNP  08/01/2023   This document may have been prepared by [facility] Voice Recognition software and as such may include unintentional dictation errors.

## 2023-08-01 NOTE — Assessment & Plan Note (Signed)
 Checking labs today.  Continue current therapy for lipid control. Will modify as needed based on labwork results.

## 2023-08-01 NOTE — Assessment & Plan Note (Signed)
 Checking labs today. Will call pt. With results  Continue current diabetes POC, as patient has been well controlled on current regimen.  Will adjust meds if needed based on labs.

## 2023-08-01 NOTE — Assessment & Plan Note (Signed)
 Blood pressure well controlled with current medications.  Continue current therapy.  Will reassess at follow up.

## 2023-08-01 NOTE — Assessment & Plan Note (Signed)
 Patient stable.  Well controlled with current therapy.   Continue current meds.

## 2023-08-01 NOTE — Assessment & Plan Note (Signed)
 Checking labs today.  Will continue supplements as needed.

## 2023-08-02 LAB — CMP14+EGFR
ALT: 43 IU/L — ABNORMAL HIGH (ref 0–32)
AST: 32 IU/L (ref 0–40)
Albumin: 4.7 g/dL (ref 3.9–4.9)
Alkaline Phosphatase: 80 IU/L (ref 44–121)
BUN/Creatinine Ratio: 12 (ref 12–28)
BUN: 12 mg/dL (ref 8–27)
Bilirubin Total: 0.5 mg/dL (ref 0.0–1.2)
CO2: 25 mmol/L (ref 20–29)
Calcium: 10.2 mg/dL (ref 8.7–10.3)
Chloride: 101 mmol/L (ref 96–106)
Creatinine, Ser: 0.97 mg/dL (ref 0.57–1.00)
Globulin, Total: 2.8 g/dL (ref 1.5–4.5)
Glucose: 156 mg/dL — ABNORMAL HIGH (ref 70–99)
Potassium: 4.7 mmol/L (ref 3.5–5.2)
Sodium: 141 mmol/L (ref 134–144)
Total Protein: 7.5 g/dL (ref 6.0–8.5)
eGFR: 66 mL/min/{1.73_m2} (ref >59)

## 2023-08-02 LAB — LIPID PANEL
Chol/HDL Ratio: 3.1 ratio (ref 0.0–4.4)
Cholesterol, Total: 102 mg/dL (ref 100–199)
HDL: 33 mg/dL — ABNORMAL LOW (ref >39)
LDL Chol Calc (NIH): 41 mg/dL (ref 0–99)
Triglycerides: 166 mg/dL — ABNORMAL HIGH (ref 0–149)
VLDL Cholesterol Cal: 28 mg/dL (ref 5–40)

## 2023-08-02 LAB — SPOTTED FEVER GROUP ANTIBODIES
Spotted Fever Group IgG: <1:64 {titer}
Spotted Fever Group IgM: <1:64 {titer}

## 2023-08-02 LAB — VITAMIN D 25 HYDROXY (VIT D DEFICIENCY, FRACTURES): Vit D, 25-Hydroxy: 39.7 ng/mL (ref 30.0–100.0)

## 2023-08-02 LAB — VITAMIN B12: Vitamin B-12: 491 pg/mL (ref 232–1245)

## 2023-08-02 LAB — HEMOGLOBIN A1C
Est. average glucose Bld gHb Est-mCnc: 192 mg/dL
Hgb A1c MFr Bld: 8.3 % — ABNORMAL HIGH (ref 4.8–5.6)

## 2023-08-02 LAB — LYME DISEASE SEROLOGY W/REFLEX: Lyme Total Antibody EIA: NEGATIVE

## 2023-08-02 LAB — TSH: TSH: 2.08 u[IU]/mL (ref 0.450–4.500)

## 2023-08-06 ENCOUNTER — Ambulatory Visit: Payer: Self-pay

## 2023-08-06 ENCOUNTER — Other Ambulatory Visit: Payer: Self-pay

## 2023-08-06 MED ORDER — GLIMEPIRIDE 2 MG PO TABS: 2.0000 mg | ORAL_TABLET | ORAL | 0 refills | Status: DC

## 2023-08-20 ENCOUNTER — Other Ambulatory Visit: Payer: Self-pay | Admitting: Family

## 2023-08-20 DIAGNOSIS — N3281 Overactive bladder: Secondary | ICD-10-CM

## 2023-09-06 ENCOUNTER — Other Ambulatory Visit: Payer: Self-pay | Admitting: Family

## 2023-09-17 ENCOUNTER — Other Ambulatory Visit: Payer: Self-pay | Admitting: Family

## 2023-09-17 DIAGNOSIS — N3281 Overactive bladder: Secondary | ICD-10-CM

## 2023-10-09 ENCOUNTER — Other Ambulatory Visit: Payer: Self-pay | Admitting: Family

## 2023-10-12 ENCOUNTER — Other Ambulatory Visit: Payer: Self-pay | Admitting: Family

## 2023-10-12 DIAGNOSIS — N3281 Overactive bladder: Secondary | ICD-10-CM

## 2023-10-20 ENCOUNTER — Other Ambulatory Visit: Payer: Self-pay | Admitting: Family

## 2023-10-21 MED ORDER — GLIMEPIRIDE 2 MG PO TABS: 2.0000 mg | ORAL_TABLET | ORAL | 0 refills | Status: DC

## 2023-10-22 ENCOUNTER — Other Ambulatory Visit: Payer: Self-pay

## 2023-10-22 ENCOUNTER — Encounter: Payer: Self-pay | Admitting: Family

## 2023-10-22 MED ORDER — IBUPROFEN 800 MG PO TABS: 800.0000 mg | ORAL_TABLET | Freq: Three times a day (TID) | ORAL | 0 refills | Status: AC | PRN

## 2023-11-06 ENCOUNTER — Other Ambulatory Visit: Payer: Self-pay | Admitting: Family

## 2023-11-13 ENCOUNTER — Other Ambulatory Visit: Payer: Self-pay | Admitting: Family

## 2023-11-13 DIAGNOSIS — N3281 Overactive bladder: Secondary | ICD-10-CM

## 2023-12-02 ENCOUNTER — Encounter: Payer: Self-pay | Admitting: Family

## 2023-12-02 ENCOUNTER — Ambulatory Visit: Payer: Self-pay | Admitting: Family

## 2023-12-02 VITALS — BP 136/82 | HR 62 | Ht 64.0 in | Wt 193.0 lb

## 2023-12-02 DIAGNOSIS — E559 Vitamin D deficiency, unspecified: Secondary | ICD-10-CM

## 2023-12-02 DIAGNOSIS — E1165 Type 2 diabetes mellitus with hyperglycemia: Secondary | ICD-10-CM

## 2023-12-02 DIAGNOSIS — W5501XA Bitten by cat, initial encounter: Secondary | ICD-10-CM

## 2023-12-02 DIAGNOSIS — I10 Essential (primary) hypertension: Secondary | ICD-10-CM

## 2023-12-02 DIAGNOSIS — S61452A Open bite of left hand, initial encounter: Secondary | ICD-10-CM

## 2023-12-02 DIAGNOSIS — R5383 Other fatigue: Secondary | ICD-10-CM

## 2023-12-02 DIAGNOSIS — E538 Deficiency of other specified B group vitamins: Secondary | ICD-10-CM

## 2023-12-02 DIAGNOSIS — F3342 Major depressive disorder, recurrent, in full remission: Secondary | ICD-10-CM

## 2023-12-02 DIAGNOSIS — E7800 Pure hypercholesterolemia, unspecified: Secondary | ICD-10-CM

## 2023-12-02 LAB — POCT CBG (FASTING - GLUCOSE)-MANUAL ENTRY: Glucose Fasting, POC: 366 mg/dL — AB (ref 70–99)

## 2023-12-02 MED ORDER — AMOXICILLIN-POT CLAVULANATE 875-125 MG PO TABS: 1.0000 | ORAL_TABLET | Freq: Two times a day (BID) | ORAL | 0 refills | Status: AC

## 2023-12-02 MED ORDER — FLUCONAZOLE 150 MG PO TABS: 150.0000 mg | ORAL_TABLET | ORAL | 0 refills | Status: AC

## 2023-12-02 NOTE — Assessment & Plan Note (Signed)
 Checking labs today.  Will continue supplements as needed.   - Vitamin D  - Vitamin B12 - TSH

## 2023-12-02 NOTE — Progress Notes (Signed)
 Established Patient Office Visit  Subjective:  Patient ID: Madison Price, female    DOB: May 06, 1960  Age: 63 y.o. MRN: [id number]  Chief Complaint  Patient presents with   Follow-up    4 month follow up    Patient is here today for her 3 months follow up.  She has been feeling well since last appointment.   She does have additional concerns to discuss today.  Cat bit her on Saturday, hand is swollen.   Labs are due today.  She needs refills.   I have reviewed her active problem list, medication list, allergies, notes from last encounter, lab results for her appointment today.     No other concerns at this time.   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   Anxiety    Arm bruise 09/21/2022   Axillary lump 09/21/2022   Cervical radiculitis 07/27/2014   Conjunctivitis 06/21/2022   Cough with expectoration 06/21/2022   Depression    Diabetes mellitus without complication (HCC)    Eclampsia (toxemia of pregnancy) 06/21/2022   Fast heart beat 09/21/2022   History of colon polyps 06/21/2022   Hyperlipidemia    Hypertension    Nerve root pain 06/21/2022   Neuralgia neuritis, sciatic nerve 06/21/2022   Pain in shoulder 06/21/2022   Tick bite 06/21/2022   Well woman exam with routine gynecological exam 07/20/2016    Past Surgical History:  Procedure Laterality Date   CESAREAN SECTION     CHOLECYSTECTOMY  2007   COLONOSCOPY WITH PROPOFOL  N/A 07/20/2019   Procedure: COLONOSCOPY WITH PROPOFOL ;  Surgeon: Toledo, Ladell POUR, MD;  Location: [facility];  Service: Gastroenterology;  Laterality: N/A;   gall bladder removed     WISDOM TOOTH EXTRACTION      Social History   Socioeconomic History   Marital status: Single    Spouse name: Not on file   Number of children: Not on file   Years of education: Not on file   Highest education  level: Not on file  Occupational History   Not on file  Tobacco Use   Smoking status: Never   Smokeless tobacco: Never  Vaping Use   Vaping status: Never Used  Substance and Sexual Activity   Alcohol use: No    Alcohol/week: 0.0 standard drinks of alcohol   Drug use: No   Sexual activity: Never  Other Topics Concern   Not on file  Social History Narrative   Not on file   Social Drivers of Health   Financial Resource Strain: Not on file  Food Insecurity: Not on file  Transportation Needs: Not on file  Physical Activity: Not on file  Stress: Not on file  Social Connections: Not on file  Intimate Partner Violence: Not on file    Family History  Problem Relation Age of Onset   Schizophrenia Mother        paranoid   Heart disease Father    Cancer Sister        breast   Breast cancer Sister 36   Cancer Maternal Aunt        colon    No Known Allergies  Review of Systems  Skin:        Left hand, cat bite   All other systems reviewed and are negative.      Objective:   BP 136/82   Pulse  62   Ht 5' 4 (1.626 m)   Wt 193 lb (87.5 kg)   LMP  (LMP Unknown)   SpO2 97%   BMI 33.13 kg/m   Vitals:   12/02/23 0854  BP: 136/82  Pulse: 62  Height: 5' 4 (1.626 m)  Weight: 193 lb (87.5 kg)  SpO2: 97%  BMI (Calculated): 33.11    Physical Exam Vitals and nursing note reviewed.  Constitutional:      Appearance: Normal appearance. She is normal weight.  HENT:     Head: Normocephalic.  Eyes:     Extraocular Movements: Extraocular movements intact.     Conjunctiva/sclera: Conjunctivae normal.     Pupils: Pupils are equal, round, and reactive to light.  Cardiovascular:     Rate and Rhythm: Normal rate.  Pulmonary:     Effort: Pulmonary effort is normal.  Skin:    Findings: Wound present.      Neurological:     General: No focal deficit present.     Mental Status: She is alert and oriented to person, place, and time. Mental status is at baseline.   Psychiatric:        Mood and Affect: Mood normal.        Behavior: Behavior normal.        Thought Content: Thought content normal.        Judgment: Judgment normal.      Results for orders placed or performed in visit on 12/02/23  POCT CBG (Fasting - Glucose)  Result Value Ref Range   Glucose Fasting, POC 366 (A) 70 - 99 mg/dL    Recent Results (from the past 2160 hours)  POCT CBG (Fasting - Glucose)     Status: Abnormal   Collection Time: 12/02/23  9:00 AM  Result Value Ref Range   Glucose Fasting, POC 366 (A) 70 - 99 mg/dL       Assessment & Plan Type 2 diabetes mellitus with hyperglycemia, without long-term current use of insulin (HCC) Checking labs today. Will call pt. With results  Continue current diabetes POC, as patient has been well controlled on current regimen.  Will adjust meds if needed based on labs.   -CBC w/Diff -CMP w/eGFR -Hemoglobin A1C  Avitaminosis D Vitamin B12 deficiency Other fatigue Checking labs today.  Will continue supplements as needed.   - Vitamin D  - Vitamin B12 - TSH  Pure hypercholesterolemia Checking labs today.  Continue current therapy for lipid control. Will modify as needed based on labwork results.   -CMP w/eGFR -Lipid Panel  Essential hypertension, benign Blood pressure well controlled with current medications.  Continue current therapy.  Will reassess at follow up.   - CBC w/Diff - CMP w/eGFR  Recurrent major depressive disorder, in full remission (HCC) Patient stable.  Well controlled with current therapy.   Continue current meds.   Cat bite of hand, left, initial encounter Sending RX for antibiotics and for fluconazole   She will let me know if this does not improve.      Return in about 4 months (around 04/02/2024).   Total time spent: 20 minutes  ALAN CHRISTELLA ARRANT, FNP  12/02/2023   This document may have been prepared by [facility] Voice Recognition software and as such may include unintentional  dictation errors.

## 2023-12-02 NOTE — Patient Instructions (Signed)
Se

## 2023-12-02 NOTE — Assessment & Plan Note (Signed)
 Patient stable.  Well controlled with current therapy.   Continue current meds.

## 2023-12-02 NOTE — Assessment & Plan Note (Signed)
 Checking labs today.  Continue current therapy for lipid control. Will modify as needed based on labwork results.   -CMP w/eGFR -Lipid Panel

## 2023-12-02 NOTE — Assessment & Plan Note (Signed)
 Blood pressure well controlled with current medications.  Continue current therapy.  Will reassess at follow up.   - CBC w/Diff - CMP w/eGFR

## 2023-12-02 NOTE — Assessment & Plan Note (Signed)
 Checking labs today. Will call pt. With results  Continue current diabetes POC, as patient has been well controlled on current regimen.  Will adjust meds if needed based on labs.   -CBC w/Diff -CMP w/eGFR -Hemoglobin A1C

## 2023-12-04 LAB — CMP14+EGFR
ALT: 35 IU/L — ABNORMAL HIGH (ref 0–32)
AST: 31 IU/L (ref 0–40)
Albumin: 4.3 g/dL (ref 3.9–4.9)
Alkaline Phosphatase: 97 IU/L (ref 44–121)
BUN/Creatinine Ratio: 17 (ref 12–28)
BUN: 13 mg/dL (ref 8–27)
Bilirubin Total: 0.5 mg/dL (ref 0.0–1.2)
CO2: 23 mmol/L (ref 20–29)
Calcium: 9.7 mg/dL (ref 8.7–10.3)
Chloride: 100 mmol/L (ref 96–106)
Creatinine, Ser: 0.76 mg/dL (ref 0.57–1.00)
Globulin, Total: 2.7 g/dL (ref 1.5–4.5)
Glucose: 311 mg/dL — ABNORMAL HIGH (ref 70–99)
Potassium: 4.9 mmol/L (ref 3.5–5.2)
Sodium: 138 mmol/L (ref 134–144)
Total Protein: 7 g/dL (ref 6.0–8.5)
eGFR: 89 mL/min/1.73 (ref >59)

## 2023-12-04 LAB — LIPID PANEL
Chol/HDL Ratio: 3.7 ratio (ref 0.0–4.4)
Cholesterol, Total: 107 mg/dL (ref 100–199)
HDL: 29 mg/dL — ABNORMAL LOW (ref >39)
LDL Chol Calc (NIH): 47 mg/dL (ref 0–99)
Triglycerides: 191 mg/dL — ABNORMAL HIGH (ref 0–149)
VLDL Cholesterol Cal: 31 mg/dL (ref 5–40)

## 2023-12-04 LAB — VITAMIN D 25 HYDROXY (VIT D DEFICIENCY, FRACTURES): Vit D, 25-Hydroxy: 27.7 ng/mL — ABNORMAL LOW (ref 30.0–100.0)

## 2023-12-04 LAB — HEMOGLOBIN A1C
Est. average glucose Bld gHb Est-mCnc: 275 mg/dL
Hgb A1c MFr Bld: 11.2 % — ABNORMAL HIGH (ref 4.8–5.6)

## 2023-12-04 LAB — VITAMIN B12: Vitamin B-12: 433 pg/mL (ref 232–1245)

## 2023-12-04 LAB — TSH: TSH: 1.98 u[IU]/mL (ref 0.450–4.500)

## 2023-12-07 ENCOUNTER — Other Ambulatory Visit: Payer: Self-pay | Admitting: Family

## 2024-02-05 ENCOUNTER — Other Ambulatory Visit: Payer: Self-pay | Admitting: Cardiology

## 2024-02-05 DIAGNOSIS — N3281 Overactive bladder: Secondary | ICD-10-CM

## 2024-02-06 ENCOUNTER — Other Ambulatory Visit: Payer: Self-pay | Admitting: Family

## 2024-02-10 ENCOUNTER — Encounter: Payer: Self-pay | Admitting: Family

## 2024-02-17 ENCOUNTER — Other Ambulatory Visit: Payer: Self-pay

## 2024-02-17 MED ORDER — METOPROLOL TARTRATE 50 MG PO TABS: 50.0000 mg | ORAL_TABLET | Freq: Two times a day (BID) | ORAL | 2 refills | Status: AC

## 2024-02-25 ENCOUNTER — Ambulatory Visit: Payer: Self-pay

## 2024-02-25 ENCOUNTER — Other Ambulatory Visit: Payer: Self-pay

## 2024-03-11 ENCOUNTER — Other Ambulatory Visit: Payer: Self-pay | Admitting: Family

## 2024-03-11 DIAGNOSIS — N3281 Overactive bladder: Secondary | ICD-10-CM

## 2024-04-02 ENCOUNTER — Ambulatory Visit: Payer: Self-pay | Admitting: Family

## 2024-04-02 DIAGNOSIS — I10 Essential (primary) hypertension: Secondary | ICD-10-CM

## 2024-04-02 DIAGNOSIS — E1165 Type 2 diabetes mellitus with hyperglycemia: Secondary | ICD-10-CM

## 2024-04-02 DIAGNOSIS — E7800 Pure hypercholesterolemia, unspecified: Secondary | ICD-10-CM

## 2024-04-02 DIAGNOSIS — E538 Deficiency of other specified B group vitamins: Secondary | ICD-10-CM

## 2024-04-07 ENCOUNTER — Encounter: Payer: Self-pay | Admitting: Family

## 2024-04-07 ENCOUNTER — Other Ambulatory Visit: Payer: Self-pay

## 2024-04-07 DIAGNOSIS — E7800 Pure hypercholesterolemia, unspecified: Secondary | ICD-10-CM

## 2024-04-07 DIAGNOSIS — E538 Deficiency of other specified B group vitamins: Secondary | ICD-10-CM

## 2024-04-07 DIAGNOSIS — R5383 Other fatigue: Secondary | ICD-10-CM

## 2024-04-07 DIAGNOSIS — I10 Essential (primary) hypertension: Secondary | ICD-10-CM

## 2024-04-07 DIAGNOSIS — F3342 Major depressive disorder, recurrent, in full remission: Secondary | ICD-10-CM

## 2024-04-07 DIAGNOSIS — E559 Vitamin D deficiency, unspecified: Secondary | ICD-10-CM

## 2024-04-07 DIAGNOSIS — E1165 Type 2 diabetes mellitus with hyperglycemia: Secondary | ICD-10-CM

## 2024-04-08 ENCOUNTER — Encounter: Payer: Self-pay | Admitting: Family

## 2024-04-08 NOTE — Assessment & Plan Note (Signed)
 Will continue supplements as needed.

## 2024-04-08 NOTE — Progress Notes (Signed)
 "  Established Patient Office Visit  Subjective:  Patient ID: Madison Price, female    DOB: 09-Sep-1960  Age: 64 y.o. MRN: [id number]  Chief Complaint  Patient presents with   Follow-up    4 month f/u    Patient is here today for her 4 months follow up.  She has been feeling fairly well since last appointment.   She does have additional concerns to discuss today.  She does not have insurance at the moment, so she has been unable to get her Mounjaro.  Says she feels well overall, but is concerned about her sugars.   Labs are due today.   She needs refills.   I have reviewed her active problem list, medication list, allergies, notes from last encounter, lab results for her appointment today.      No other concerns at this time.   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   Anxiety    Arm bruise 09/21/2022   Axillary lump 09/21/2022   Cervical radiculitis 07/27/2014   Conjunctivitis 06/21/2022   Cough with expectoration 06/21/2022   Depression    Diabetes mellitus without complication (HCC)    Eclampsia (toxemia of pregnancy) 06/21/2022   Fast heart beat 09/21/2022   History of colon polyps 06/21/2022   Hyperlipidemia    Hypertension    Nerve root pain 06/21/2022   Neuralgia neuritis, sciatic nerve 06/21/2022   Pain in shoulder 06/21/2022   Tick bite 06/21/2022   Well woman exam with routine gynecological exam 07/20/2016    Past Surgical History:  Procedure Laterality Date   CESAREAN SECTION     CHOLECYSTECTOMY  2007   COLONOSCOPY WITH PROPOFOL  N/A 07/20/2019   Procedure: COLONOSCOPY WITH PROPOFOL ;  Surgeon: Toledo, Ladell POUR, MD;  Location: [facility];  Service: Gastroenterology;  Laterality: N/A;   gall bladder removed     WISDOM TOOTH EXTRACTION      Social History   Socioeconomic History   Marital status: Single    Spouse  name: Not on file   Number of children: Not on file   Years of education: Not on file   Highest education level: Not on file  Occupational History   Not on file  Tobacco Use   Smoking status: Never   Smokeless tobacco: Never  Vaping Use   Vaping status: Never Used  Substance and Sexual Activity   Alcohol use: No    Alcohol/week: 0.0 standard drinks of alcohol   Drug use: No   Sexual activity: Never  Other Topics Concern   Not on file  Social History Narrative   Not on file   Social Drivers of Health   Tobacco Use: Low Risk (12/02/2023)   Patient History    Smoking Tobacco Use: Never    Smokeless Tobacco Use: Never    Passive Exposure: Not on file  Financial Resource Strain: Not on file  Food Insecurity: Not on file  Transportation Needs: Not on file  Physical Activity: Not on file  Stress: Not on file  Social Connections: Not on file  Intimate Partner Violence: Not on file  Depression (PHQ2-9): Low Risk (08/01/2023)   Depression (PHQ2-9)    PHQ-2 Score: 2  Alcohol Screen: Not on file  Housing: Not on file  Utilities: Not on file  Health Literacy: Not on file    Family History  Problem Relation Age of Onset  Schizophrenia Mother        paranoid   Heart disease Father    Cancer Sister        breast   Breast cancer Sister 65   Cancer Maternal Aunt        colon    Allergies[1]  Review of Systems  All other systems reviewed and are negative.      Objective:   LMP  (LMP Unknown)   There were no vitals filed for this visit.  Physical Exam Vitals and nursing note reviewed.  Constitutional:      Appearance: Normal appearance. She is normal weight.  HENT:     Head: Normocephalic.  Eyes:     Extraocular Movements: Extraocular movements intact.     Conjunctiva/sclera: Conjunctivae normal.     Pupils: Pupils are equal, round, and reactive to light.  Cardiovascular:     Rate and Rhythm: Normal rate and regular rhythm.  Pulmonary:     Effort: Pulmonary  effort is normal.  Musculoskeletal:     Cervical back: Normal range of motion.  Neurological:     General: No focal deficit present.     Mental Status: She is alert and oriented to person, place, and time. Mental status is at baseline.  Psychiatric:        Mood and Affect: Mood normal.        Behavior: Behavior normal.        Thought Content: Thought content normal.        Judgment: Judgment normal.      No results found for any visits on 04/02/24.  No results found for this or any previous visit (from the past 2160 hours).     Assessment & Plan Vitamin B12 deficiency Will continue supplements as needed.   Type 2 diabetes mellitus with hyperglycemia, without long-term current use of insulin (HCC) Checking labs today. Will call pt. With results  Continue current diabetes POC, as patient has been well controlled on current regimen.  Will adjust meds if needed based on labs.   -CBC w/Diff -CMP w/eGFR -Hemoglobin A1C  Pure hypercholesterolemia Checking labs today.  Continue current therapy for lipid control. Will modify as needed based on labwork results.   -CMP w/eGFR -Lipid Panel  Essential hypertension, benign Blood pressure well controlled with current medications.  Continue current therapy.  Will reassess at follow up.   - CBC w/Diff - CMP w/eGFR     Return in about 4 months (around 07/31/2024).   Total time spent: 20 minutes  ALAN CHRISTELLA ARRANT, FNP  04/02/2024   This document may have been prepared by [facility] Voice Recognition software and as such may include unintentional dictation errors.      [1] No Known Allergies  "

## 2024-04-08 NOTE — Assessment & Plan Note (Signed)
 Checking labs today. Will call pt. With results  Continue current diabetes POC, as patient has been well controlled on current regimen.  Will adjust meds if needed based on labs.   -CBC w/Diff -CMP w/eGFR -Hemoglobin A1C

## 2024-04-08 NOTE — Assessment & Plan Note (Signed)
 Checking labs today.  Continue current therapy for lipid control. Will modify as needed based on labwork results.   -CMP w/eGFR -Lipid Panel

## 2024-04-08 NOTE — Assessment & Plan Note (Signed)
 Blood pressure well controlled with current medications.  Continue current therapy.  Will reassess at follow up.   - CBC w/Diff - CMP w/eGFR

## 2024-04-14 ENCOUNTER — Other Ambulatory Visit: Payer: Self-pay | Admitting: Family

## 2024-04-14 DIAGNOSIS — N3281 Overactive bladder: Secondary | ICD-10-CM

## 2024-04-19 ENCOUNTER — Other Ambulatory Visit: Payer: Self-pay | Admitting: Family

## 2024-07-31 ENCOUNTER — Ambulatory Visit: Payer: Self-pay | Admitting: Family
